# Patient Record
Sex: Female | Born: 1970 | Race: White | Hispanic: No | Marital: Married | Smoking: Current every day smoker
Health system: Southern US, Community
[De-identification: ages and names within clinical notes are randomized; demographics above are authoritative.]

## PROBLEM LIST (undated history)

## (undated) ENCOUNTER — Emergency Department (HOSPITAL_COMMUNITY): Admission: EM | Discharge status: Against Medical Advice | Payer: PRIVATE HEALTH INSURANCE

## (undated) DIAGNOSIS — F419 Anxiety disorder, unspecified: Secondary | ICD-10-CM

## (undated) HISTORY — DX: Anxiety disorder, unspecified: F41.9

## (undated) HISTORY — PX: TONSILLECTOMY: SUR1361

## (undated) HISTORY — PX: TUBAL LIGATION: SHX77

---

## 1998-01-13 ENCOUNTER — Other Ambulatory Visit: Admission: RE | Admit: 1998-01-13 | Discharge: 1998-01-13 | Payer: Self-pay | Admitting: Obstetrics and Gynecology

## 1999-01-19 ENCOUNTER — Other Ambulatory Visit: Admission: RE | Admit: 1999-01-19 | Discharge: 1999-01-19 | Payer: Self-pay | Admitting: *Deleted

## 1999-10-20 ENCOUNTER — Emergency Department (HOSPITAL_COMMUNITY): Admission: EM | Admit: 1999-10-20 | Discharge: 1999-10-20 | Payer: Self-pay | Admitting: Emergency Medicine

## 2000-01-15 ENCOUNTER — Emergency Department (HOSPITAL_COMMUNITY): Admission: EM | Admit: 2000-01-15 | Discharge: 2000-01-15 | Payer: Self-pay | Admitting: Emergency Medicine

## 2010-12-28 ENCOUNTER — Other Ambulatory Visit: Payer: Self-pay | Admitting: Infectious Diseases

## 2010-12-28 ENCOUNTER — Ambulatory Visit
Admission: RE | Admit: 2010-12-28 | Discharge: 2010-12-28 | Disposition: A | Discharge status: Home / Self Care | Payer: PRIVATE HEALTH INSURANCE | Source: Ambulatory Visit | Attending: Infectious Diseases | Admitting: Infectious Diseases

## 2010-12-28 DIAGNOSIS — R7611 Nonspecific reaction to tuberculin skin test without active tuberculosis: Secondary | ICD-10-CM

## 2014-04-29 ENCOUNTER — Other Ambulatory Visit: Payer: Self-pay | Admitting: Family Medicine

## 2014-04-29 DIAGNOSIS — Z1231 Encounter for screening mammogram for malignant neoplasm of breast: Secondary | ICD-10-CM

## 2014-05-06 ENCOUNTER — Other Ambulatory Visit: Payer: Self-pay | Admitting: Family Medicine

## 2014-05-06 DIAGNOSIS — Z1231 Encounter for screening mammogram for malignant neoplasm of breast: Secondary | ICD-10-CM

## 2014-05-11 ENCOUNTER — Ambulatory Visit
Admission: RE | Admit: 2014-05-11 | Discharge: 2014-05-11 | Disposition: A | Discharge status: Home / Self Care | Payer: PRIVATE HEALTH INSURANCE | Source: Ambulatory Visit | Attending: Family Medicine | Admitting: Family Medicine

## 2014-05-11 DIAGNOSIS — Z1231 Encounter for screening mammogram for malignant neoplasm of breast: Secondary | ICD-10-CM

## 2014-05-13 ENCOUNTER — Other Ambulatory Visit: Payer: Self-pay | Admitting: Family Medicine

## 2014-05-13 DIAGNOSIS — R928 Other abnormal and inconclusive findings on diagnostic imaging of breast: Secondary | ICD-10-CM

## 2014-05-31 ENCOUNTER — Ambulatory Visit
Admission: RE | Admit: 2014-05-31 | Discharge: 2014-05-31 | Disposition: A | Discharge status: Home / Self Care | Payer: PRIVATE HEALTH INSURANCE | Source: Ambulatory Visit | Attending: Family Medicine | Admitting: Family Medicine

## 2014-05-31 DIAGNOSIS — R928 Other abnormal and inconclusive findings on diagnostic imaging of breast: Secondary | ICD-10-CM

## 2014-07-05 ENCOUNTER — Inpatient Hospital Stay (HOSPITAL_COMMUNITY)
Admission: EM | Admit: 2014-07-05 | Discharge: 2014-07-10 | DRG: 339 | Disposition: A | Discharge status: Home / Self Care | Payer: No Typology Code available for payment source | Attending: Surgery | Admitting: Surgery

## 2014-07-05 ENCOUNTER — Emergency Department (HOSPITAL_COMMUNITY): Payer: No Typology Code available for payment source

## 2014-07-05 ENCOUNTER — Encounter (HOSPITAL_COMMUNITY): Payer: Self-pay | Admitting: *Deleted

## 2014-07-05 DIAGNOSIS — D649 Anemia, unspecified: Secondary | ICD-10-CM | POA: Diagnosis present

## 2014-07-05 DIAGNOSIS — F17210 Nicotine dependence, cigarettes, uncomplicated: Secondary | ICD-10-CM | POA: Diagnosis present

## 2014-07-05 DIAGNOSIS — K651 Peritoneal abscess: Secondary | ICD-10-CM

## 2014-07-05 DIAGNOSIS — K3532 Acute appendicitis with perforation and localized peritonitis, without abscess: Secondary | ICD-10-CM

## 2014-07-05 DIAGNOSIS — R109 Unspecified abdominal pain: Secondary | ICD-10-CM | POA: Diagnosis present

## 2014-07-05 DIAGNOSIS — D3502 Benign neoplasm of left adrenal gland: Secondary | ICD-10-CM | POA: Diagnosis present

## 2014-07-05 DIAGNOSIS — G4700 Insomnia, unspecified: Secondary | ICD-10-CM | POA: Diagnosis present

## 2014-07-05 DIAGNOSIS — K353 Acute appendicitis with localized peritonitis: Secondary | ICD-10-CM | POA: Diagnosis present

## 2014-07-05 DIAGNOSIS — K567 Ileus, unspecified: Secondary | ICD-10-CM | POA: Diagnosis not present

## 2014-07-05 DIAGNOSIS — J45909 Unspecified asthma, uncomplicated: Secondary | ICD-10-CM | POA: Diagnosis present

## 2014-07-05 DIAGNOSIS — Z800 Family history of malignant neoplasm of digestive organs: Secondary | ICD-10-CM

## 2014-07-05 DIAGNOSIS — K3533 Acute appendicitis with perforation and localized peritonitis, with abscess: Secondary | ICD-10-CM | POA: Diagnosis present

## 2014-07-05 LAB — CBC WITH DIFFERENTIAL/PLATELET
Basophils Absolute: 0 10*3/uL (ref 0.0–0.1)
Basophils Relative: 0 % (ref 0–1)
Eosinophils Absolute: 0.2 10*3/uL (ref 0.0–0.7)
Eosinophils Relative: 2 % (ref 0–5)
HCT: 38.7 % (ref 36.0–46.0)
Hemoglobin: 12.3 g/dL (ref 12.0–15.0)
LYMPHS ABS: 1.6 10*3/uL (ref 0.7–4.0)
LYMPHS PCT: 14 % (ref 12–46)
MCH: 28.9 pg (ref 26.0–34.0)
MCHC: 31.8 g/dL (ref 30.0–36.0)
MCV: 90.8 fL (ref 78.0–100.0)
Monocytes Absolute: 1 10*3/uL (ref 0.1–1.0)
Monocytes Relative: 8 % (ref 3–12)
NEUTROS ABS: 8.7 10*3/uL — AB (ref 1.7–7.7)
NEUTROS PCT: 76 % (ref 43–77)
PLATELETS: 320 10*3/uL (ref 150–400)
RBC: 4.26 MIL/uL (ref 3.87–5.11)
RDW: 14.6 % (ref 11.5–15.5)
WBC: 11.5 10*3/uL — AB (ref 4.0–10.5)

## 2014-07-05 LAB — COMPREHENSIVE METABOLIC PANEL
ALK PHOS: 153 U/L — AB (ref 39–117)
ALT: 56 U/L — AB (ref 0–35)
AST: 15 U/L (ref 0–37)
Albumin: 2.9 g/dL — ABNORMAL LOW (ref 3.5–5.2)
Anion gap: 14 (ref 5–15)
BUN: 10 mg/dL (ref 6–23)
CHLORIDE: 96 meq/L (ref 96–112)
CO2: 26 meq/L (ref 19–32)
Calcium: 9.2 mg/dL (ref 8.4–10.5)
Creatinine, Ser: 0.53 mg/dL (ref 0.50–1.10)
GFR calc Af Amer: >90 mL/min (ref >90)
GLUCOSE: 97 mg/dL (ref 70–99)
POTASSIUM: 4 meq/L (ref 3.7–5.3)
SODIUM: 136 meq/L — AB (ref 137–147)
Total Bilirubin: 0.3 mg/dL (ref 0.3–1.2)
Total Protein: 7.2 g/dL (ref 6.0–8.3)

## 2014-07-05 LAB — URINE MICROSCOPIC-ADD ON

## 2014-07-05 LAB — LIPASE, BLOOD: Lipase: 17 U/L (ref 11–59)

## 2014-07-05 LAB — URINALYSIS, ROUTINE W REFLEX MICROSCOPIC
BILIRUBIN URINE: NEGATIVE
GLUCOSE, UA: NEGATIVE mg/dL
Hgb urine dipstick: NEGATIVE
KETONES UR: NEGATIVE mg/dL
Nitrite: NEGATIVE
PH: 5 (ref 5.0–8.0)
Protein, ur: NEGATIVE mg/dL
SPECIFIC GRAVITY, URINE: 1.017 (ref 1.005–1.030)
Urobilinogen, UA: 0.2 mg/dL (ref 0.0–1.0)

## 2014-07-05 LAB — POC URINE PREG, ED: Preg Test, Ur: NEGATIVE

## 2014-07-05 MED ORDER — IOHEXOL 300 MG/ML  SOLN
50.0000 mL | Freq: Once | INTRAMUSCULAR | Status: AC | PRN
  Administered 2014-07-05: 50 mL via ORAL

## 2014-07-05 MED ORDER — DIPHENHYDRAMINE HCL 12.5 MG/5ML PO ELIX: 12.5000 mg | ORAL_SOLUTION | Freq: Four times a day (QID) | ORAL | Status: DC | PRN

## 2014-07-05 MED ORDER — PIPERACILLIN-TAZOBACTAM 3.375 G IVPB
3.3750 g | Freq: Three times a day (TID) | INTRAVENOUS | Status: DC
  Administered 2014-07-06 – 2014-07-10 (×13): 3.375 g via INTRAVENOUS
  Filled 2014-07-05 (×14): qty 50

## 2014-07-05 MED ORDER — PIPERACILLIN-TAZOBACTAM 3.375 G IVPB: 3.3750 g | INTRAVENOUS | Status: DC

## 2014-07-05 MED ORDER — HEPARIN SODIUM (PORCINE) 5000 UNIT/ML IJ SOLN
5000.0000 [IU] | Freq: Three times a day (TID) | INTRAMUSCULAR | Status: DC
  Administered 2014-07-05 – 2014-07-07 (×4): 5000 [IU] via SUBCUTANEOUS
  Filled 2014-07-05 (×8): qty 1

## 2014-07-05 MED ORDER — IOHEXOL 300 MG/ML  SOLN
100.0000 mL | Freq: Once | INTRAMUSCULAR | Status: AC | PRN
  Administered 2014-07-05: 100 mL via INTRAVENOUS

## 2014-07-05 MED ORDER — PANTOPRAZOLE SODIUM 40 MG IV SOLR
40.0000 mg | Freq: Every day | INTRAVENOUS | Status: DC
  Administered 2014-07-05 – 2014-07-08 (×4): 40 mg via INTRAVENOUS
  Filled 2014-07-05 (×5): qty 40

## 2014-07-05 MED ORDER — ZOLPIDEM TARTRATE 5 MG PO TABS
5.0000 mg | ORAL_TABLET | Freq: Every day | ORAL | Status: DC
  Administered 2014-07-05 – 2014-07-09 (×5): 5 mg via ORAL
  Filled 2014-07-05 (×5): qty 1

## 2014-07-05 MED ORDER — ALBUTEROL SULFATE HFA 108 (90 BASE) MCG/ACT IN AERS: 1.0000 | INHALATION_SPRAY | RESPIRATORY_TRACT | Status: DC | PRN

## 2014-07-05 MED ORDER — POTASSIUM CHLORIDE IN NACL 20-0.9 MEQ/L-% IV SOLN
INTRAVENOUS | Status: DC
  Administered 2014-07-05 – 2014-07-08 (×7): via INTRAVENOUS
  Filled 2014-07-05 (×12): qty 1000

## 2014-07-05 MED ORDER — ONDANSETRON HCL 4 MG/2ML IJ SOLN
4.0000 mg | Freq: Four times a day (QID) | INTRAMUSCULAR | Status: DC | PRN
  Administered 2014-07-06 – 2014-07-09 (×2): 4 mg via INTRAVENOUS
  Filled 2014-07-05 (×2): qty 2

## 2014-07-05 MED ORDER — SODIUM CHLORIDE 0.9 % IV BOLUS (SEPSIS)
1000.0000 mL | Freq: Once | INTRAVENOUS | Status: AC
  Administered 2014-07-05: 1000 mL via INTRAVENOUS

## 2014-07-05 MED ORDER — PIPERACILLIN-TAZOBACTAM 3.375 G IVPB: 3.3750 g | Freq: Three times a day (TID) | INTRAVENOUS | Status: DC

## 2014-07-05 MED ORDER — PIPERACILLIN-TAZOBACTAM 3.375 G IVPB 30 MIN
3.3750 g | INTRAVENOUS | Status: AC
  Administered 2014-07-05: 3.375 g via INTRAVENOUS
  Filled 2014-07-05: qty 50

## 2014-07-05 MED ORDER — MONTELUKAST SODIUM 10 MG PO TABS
10.0000 mg | ORAL_TABLET | Freq: Every day | ORAL | Status: DC | PRN
  Filled 2014-07-05: qty 1

## 2014-07-05 MED ORDER — ACETAMINOPHEN 325 MG PO TABS
650.0000 mg | ORAL_TABLET | Freq: Four times a day (QID) | ORAL | Status: DC | PRN
  Administered 2014-07-07: 650 mg via ORAL
  Filled 2014-07-05: qty 2

## 2014-07-05 MED ORDER — DIPHENHYDRAMINE HCL 50 MG/ML IJ SOLN: 12.5000 mg | Freq: Four times a day (QID) | INTRAMUSCULAR | Status: DC | PRN

## 2014-07-05 MED ORDER — ACETAMINOPHEN 650 MG RE SUPP: 650.0000 mg | Freq: Four times a day (QID) | RECTAL | Status: DC | PRN

## 2014-07-05 MED ORDER — MORPHINE SULFATE 2 MG/ML IJ SOLN
1.0000 mg | INTRAMUSCULAR | Status: DC | PRN
  Administered 2014-07-05: 4 mg via INTRAVENOUS
  Administered 2014-07-05: 2 mg via INTRAVENOUS
  Administered 2014-07-06 – 2014-07-08 (×18): 4 mg via INTRAVENOUS
  Filled 2014-07-05 (×14): qty 2
  Filled 2014-07-05: qty 1
  Filled 2014-07-05 (×5): qty 2

## 2014-07-05 NOTE — Progress Notes (Signed)
  CARE MANAGEMENT ED NOTE 07/05/2014  Patient:  Kelli Lamb, Kelli Lamb   Account Number:  [id number]  Date Initiated:  07/05/2014  Documentation initiated by:  Livia Snellen  Subjective/Objective Assessment:   Patient presents to Ed with abdominal pain and fever for one week.     Subjective/Objective Assessment Detail:   CT of abdomen: Findings consistent with periappendiceal abscess, likely due to  appendiceal rupture. This periappendiceal abscess measures 5.1 x 3.6  x 2.5 cm. A second developing abscess is noted slightly superior to  the urinary bladder on the right measuring 5.5 x 3.5 x 2.9 cm. There  is extensive inflammation throughout the pelvis with wall thickening  in the urinary bladder likely due to the nearby abscess as well as  multiple loops of bowel with thickened walls and surrounding  mesenteric inflammation. Small amount of fluid in the cul-de-sac is  likely due to the inflammation from the periappendiceal abscesses as  well.     Action/Plan:   NPO, IV fluids, IV antibiotics   Action/Plan Detail:   Anticipated DC Date:       Status Recommendation to Physician:   Result of Recommendation:    Other ED [place]  Other  PCP issues    Choice offered to / List presented to:            Status of service:  Completed, signed off  ED Comments:   ED Comments Detail:  EDCM spoke to patient at bedside.  Patient confirms he pcp is located at [facility] in [place].  Dr. Leonia Reader.  System updated.

## 2014-07-05 NOTE — ED Provider Notes (Signed)
CSN: [id number]     Arrival date & time 07/05/14  1342 History   First MD Initiated Contact with Patient 07/05/14 1500     Chief Complaint  Patient presents with  . Abdominal Pain     (Consider location/radiation/quality/duration/timing/severity/associated sxs/prior Treatment) HPI Patient presents to the emergency department with abdominal pain that started 1 week ago.  The patient states she woke up late Monday evening with abdominal pain that worsened over the next couple of days.  She said she was seen by her primary care doctor who did a plain film of her abdomen and noted that she had constipation and possibly an intense large liver.  The patient states that she was put on lactulose and over-the-counter laxatives.  Patient states that her abdominal pain worsened.  She was called on Friday with a report that her white blood cell count was 19,000.  She states that she felt like she could no longer continue the laxatives and came in to the emergency room department for evaluation.  The patient states she has not had any vomiting, diarrhea, weakness, dizziness, headache, blurred vision, chest pain, shortness of breath, rash, near syncope, lightheadedness or syncope.  The patient states she has had some nausea as well.  Patient states nothing seems to make her condition better, but palpation and movement make the pain worse. History reviewed. No pertinent past medical history. History reviewed. No pertinent past surgical history. History reviewed. No pertinent family history. History  Substance Use Topics  . Smoking status: Current Every Day Smoker -- 0.50 packs/day for 5 years    Types: Cigarettes  . Smokeless tobacco: Not on file  . Alcohol Use: No   OB History    No data available     Review of Systems All other systems negative except as documented in the HPI. All pertinent positives and negatives as reviewed in the HPI.  Allergies  Review of patient's allergies indicates no known  allergies.  Home Medications   Prior to Admission medications   Medication Sig Start Date End Date Taking? Authorizing Provider  albuterol (PROVENTIL HFA;VENTOLIN HFA) 108 (90 BASE) MCG/ACT inhaler Inhale 1 puff into the lungs every 4 (four) hours as needed for wheezing or shortness of breath.   Yes Historical Provider, MD  ciprofloxacin (CIPRO) 500 MG tablet Take 500 mg by mouth 2 (two) times daily. She is taking for 7 days. She started on 06/30/14 and has not completed. 06/30/14  Yes Historical Provider, MD  HYDROMET 5-1.5 MG/5ML syrup Take 5 mLs by mouth every 6 (six) hours as needed for cough.  06/16/14  Yes Historical Provider, MD  lactulose (CHRONULAC) 10 GM/15ML solution  06/30/14  Yes Historical Provider, MD  montelukast (SINGULAIR) 10 MG tablet Take 10 mg by mouth daily as needed (For allergies.).  04/15/14  Yes Historical Provider, MD  zolpidem (AMBIEN) 10 MG tablet Take 10 mg by mouth at bedtime.  06/23/14  Yes Historical Provider, MD   BP 124/72 mmHg  Pulse 90  Temp(Src) 98.2 F (36.8 C) (Oral)  Resp 18  SpO2 95%  LMP 06/11/2014 (Exact Date) Physical Exam  Constitutional: She is oriented to person, place, and time. She appears well-developed and well-nourished. No distress.  HENT:  Head: Normocephalic and atraumatic.  Mouth/Throat: Oropharynx is clear and moist.  Eyes: EOM are normal. Pupils are equal, round, and reactive to light.  Neck: Normal range of motion. Neck supple.  Cardiovascular: Normal rate, regular rhythm and normal heart sounds.  Exam reveals no  gallop and no friction rub.   No murmur heard. Pulmonary/Chest: Effort normal and breath sounds normal. No respiratory distress. She has no wheezes. She has no rales.  Abdominal: Soft. Normal appearance and bowel sounds are normal. She exhibits no distension. There is tenderness. There is guarding. There is no rigidity, no rebound and no CVA tenderness. No hernia.    Neurological: She is alert and oriented to person,  place, and time. She exhibits normal muscle tone. Coordination normal.  Skin: Skin is warm and dry. No rash noted. No erythema.  Nursing note and vitals reviewed.   ED Course  Procedures (including critical care time) Labs Review Labs Reviewed  CBC WITH DIFFERENTIAL - Abnormal; Notable for the following:    WBC 11.5 (*)    Neutro Abs 8.7 (*)    All other components within normal limits  COMPREHENSIVE METABOLIC PANEL - Abnormal; Notable for the following:    Sodium 136 (*)    Albumin 2.9 (*)    ALT 56 (*)    Alkaline Phosphatase 153 (*)    All other components within normal limits  URINALYSIS, ROUTINE W REFLEX MICROSCOPIC - Abnormal; Notable for the following:    Leukocytes, UA MODERATE (*)    All other components within normal limits  URINE MICROSCOPIC-ADD ON - Abnormal; Notable for the following:    Squamous Epithelial / LPF FEW (*)    All other components within normal limits  LIPASE, BLOOD  POC URINE PREG, ED    Imaging Review Ct Abdomen Pelvis W Contrast  07/05/2014   CLINICAL DATA:  One week history of fever abdominal pain. Elevated white blood cell count  EXAM: CT ABDOMEN AND PELVIS WITH CONTRAST  TECHNIQUE: Multidetector CT imaging of the abdomen and pelvis was performed using the standard protocol following bolus administration of intravenous contrast. Oral contrast was also administered.  CONTRAST:  29mL OMNIPAQUE IOHEXOL 300 MG/ML SOLN, 139mL OMNIPAQUE IOHEXOL 300 MG/ML SOLN  COMPARISON:  None.  FINDINGS: There is mild atelectasis in the right middle lobe. Lung bases are otherwise clear.  Liver is enlarged, measuring 21.8 cm in length. No focal liver lesions are identified. Gallbladder wall is not appreciably thickened. There is no biliary duct dilatation.  Spleen, pancreas, and right adrenal appear normal. There is a left adrenal adenoma measuring 2.6 x 1.7 cm.  Kidneys bilaterally show no mass or hydronephrosis on either side. There is no renal or ureteral calculus on  either side.  In the pelvis, the urinary bladder wall is thickened. There is no pelvic mass. There is a small amount of free fluid in the cul-de-sac. There is extensive inflammation throughout the pelvis with wall thickening as well as thickening of multiple loops of bowel in the pelvis. There is a developing abscess immediately adjacent to the cecum in the mid pelvis measuring 5.1 x 3.6 x 2.5 cm. This finding is most likely due to ruptured appendix with developing periappendiceal region abscess. There is wall thickening in the adjacent cecum as well as thickening of the wall of the terminal ileum, likely due to inflammation from the periappendiceal region abscess. A second developing abscess is noted slightly superior to the urinary bladder in the right mid pelvis measuring 5.5 x 3.5 x 2.9 cm. The uterus appears somewhat enlarged. The uterus may well be edematous from the surrounding inflammation.  There is no appreciable bowel obstruction. There is no free air or portal venous air.  No adenopathy is identified in the abdomen or pelvis. Aorta is nonaneurysmal.  There are no blastic or lytic bone lesions.  IMPRESSION: Findings consistent with periappendiceal abscess, likely due to appendiceal rupture. This periappendiceal abscess measures 5.1 x 3.6 x 2.5 cm. A second developing abscess is noted slightly superior to the urinary bladder on the right measuring 5.5 x 3.5 x 2.9 cm. There is extensive inflammation throughout the pelvis with wall thickening in the urinary bladder likely due to the nearby abscess as well as multiple loops of bowel with thickened walls and surrounding mesenteric inflammation. Small amount of fluid in the cul-de-sac is likely due to the inflammation from the periappendiceal abscesses as well.  Uterus is prominent, likely due at least in part to edema from the surrounding inflammation. Leiomyomatous change in the uterus cannot be excluded, however.  Liver enlarged without focal lesion.  There  is a left adrenal adenoma, a benign finding.  Critical Value/emergent results were called by telephone at the time of interpretation on 07/05/2014 at 5:08 pm to [facility], [facility] , who verbally acknowledged these results.   Electronically Signed   By: Lowella Grip M.D.   On: 07/05/2014 17:10      MDM   Final diagnoses:  None    I spoke with general surgery.  The patient and they will be in to evaluate her for surgical intervention    Brent General, PA-C 07/05/14 1750  Evelina Bucy, MD 07/05/14 Lurena Nida

## 2014-07-05 NOTE — Progress Notes (Signed)
Utilization Review completed.  Teruko Brinae Woods RN CM  

## 2014-07-05 NOTE — ED Notes (Signed)
Pt reports fever and abdominal pain x1 week. Went to pcp last wed, xray showed that pts liver was enlarged and constipated. pcp called and told pt her WBC was 19. Pt started cipro on Wednesday and lactulose. Last normal bowel movement 12/14. Abdominal pain 5/10-10/10. Reports nausea. Denies vomiting.

## 2014-07-05 NOTE — ED Notes (Signed)
Wed Started on Lactulose and stool softner. No BM in 1 week.

## 2014-07-05 NOTE — Progress Notes (Addendum)
ANTIBIOTIC CONSULT NOTE - INITIAL  Pharmacy Consult for zosyn Indication: perforated appendix  No Known Allergies  Patient Measurements:   Adjusted Body Weight:   Vital Signs: Temp: 98.2 F (36.8 C) (12/21 1719) Temp Source: Oral (12/21 1719) BP: 122/74 mmHg (12/21 1830) Pulse Rate: 93 (12/21 1730) Intake/Output from previous day:   Intake/Output from this shift: Total I/O In: 1000 [I.V.:1000] Out: -   Labs:  Recent Labs  07/05/14 1441  WBC 11.5*  HGB 12.3  PLT 320  CREATININE 0.53   CrCl cannot be calculated (Unknown ideal weight.). No results for input(s): VANCOTROUGH, VANCOPEAK, VANCORANDOM, GENTTROUGH, GENTPEAK, GENTRANDOM, TOBRATROUGH, TOBRAPEAK, TOBRARND, AMIKACINPEAK, AMIKACINTROU, AMIKACIN in the last 72 hours.   Microbiology: No results found for this or any previous visit (from the past 720 hour(s)).  Medical History: History reviewed. No pertinent past medical history.   Assessment: 5 YOf presents to ED with abdominal pain and fever x 1 week.  CT reveals extensive inflammation throughout pelvis  And developing abscess adjacent to cecum  - these are consistent with ruptured appendix.  Orders for pharmacy to dose zosyn  12/21 >> zosyn  >>  Tmax: afeb WBCs: slightly elevated Renal: SCr WNL  No cultures ordered  Goal of Therapy:  Dose for indication and patient-specific parameters  Plan:   Zosyn 3.375gm IV x 1 over 45min in ED then 3.375gm IV q8h over 4h infusion  No further dosing adjustment needed at this time  Doreene Eland, PharmD, BCPS.   Pager: [phone]  07/05/2014,6:40 PM

## 2014-07-05 NOTE — H&P (Signed)
Kelli Lamb is an 43 y.o. female.   PCP:    Chief Complaint: Abd pain HPI: Pt reports abdominal pain and fever for about 1 week. It started last week on Monday evening.  Pain and fever up to 102.  She stayed in be on Tuesday, and Wed she saw her PCP.   Films showed enlarged liver, and constipation.  She was found to have an elevated WBC and started on Cipro/lacutlose 06/30/14.  Last BM 06/28/14. Since then she has not improved,she had an Ultrasound of the abdomen on Saturday 07/03/14, which she had no report on till today.  It was reported normal but pt felt no better and came to the ED today after lunch time. She has not been able to eat for most of the week.  Right now she is tender and has discomfort both right and left lower abdomen, left side more so than right side right now.   Work up in the ED shows she is afebrile, a little tachycardic, WBC is mildly elevated, 11.5.  ALT is up slightly. CT scan shows;  There is extensive inflammation throughout the pelvis with wall thickening as well as thickening of multiple loops of bowel in the pelvis. There is a developing abscess immediately adjacent to the cecum in the mid pelvis measuring 5.1 x 3.6 x 2.5 cm. This finding is most likely due to ruptured appendix with developing periappendiceal region abscess. There is wall thickening in the adjacent cecum as well as thickening of the wall of the terminal ileum, likely due to inflammation from the periappendiceal region abscess. A second developing abscess is noted slightly superior to the urinary bladder in the right mid pelvis measuring 5.5 x 3.5 x 2.9 cm. The uterus appears somewhat enlarged. The uterus may well be edematous from the surrounding inflammation. No obstruction free, air or portal gas. Films reviewed with Dr. Excell Seltzer and we plan to admit, place on IV fluids, antibiotics and order drain placement for the AM.   History reviewed. No pertinent past medical history. None History reviewed. No  pertinent past surgical history. None History reviewed. No pertinent family history. Social History:  reports that she has been smoking Cigarettes.  She has a 2.5 pack-year smoking history. She does not have any smokeless tobacco history on file. She reports that she does not drink alcohol or use illicit drugs. 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 Allergies: No Known Allergies   Prior to Admission medications   Medication Sig Start Date End Date Taking? Authorizing Provider  albuterol (PROVENTIL HFA;VENTOLIN HFA) 108 (90 BASE) MCG/ACT inhaler Inhale 1 puff into the lungs every 4 (four) hours as needed for wheezing or shortness of breath.   Yes Historical Provider, MD  ciprofloxacin (CIPRO) 500 MG tablet Take 500 mg by mouth 2 (two) times daily. She is taking for 7 days. She started on 06/30/14 and has not completed. 06/30/14  Yes Historical Provider, MD  HYDROMET 5-1.5 MG/5ML syrup Take 5 mLs by mouth every 6 (six) hours as needed for cough.  06/16/14  Yes Historical Provider, MD  lactulose (CHRONULAC) 10 GM/15ML solution  06/30/14  Yes Historical Provider, MD  montelukast (SINGULAIR) 10 MG tablet Take 10 mg by mouth daily as needed (For allergies.).  04/15/14  Yes Historical Provider, MD  zolpidem (AMBIEN) 10 MG tablet Take 10 mg by mouth at bedtime.  06/23/14  Yes Historical Provider, MD     Results for orders placed or performed during the hospital encounter of 07/05/14 (from the past 48 hour(s))  Urinalysis, Routine w reflex microscopic     Status: Abnormal   Collection Time: 07/05/14  2:09 PM  Result Value Ref Range   Color, Urine YELLOW YELLOW   APPearance CLEAR CLEAR   Specific Gravity, Urine 1.017 1.005 - 1.030   pH 5.0 5.0 - 8.0   Glucose, UA NEGATIVE NEGATIVE mg/dL   Hgb urine dipstick NEGATIVE NEGATIVE   Bilirubin Urine NEGATIVE NEGATIVE   Ketones, ur NEGATIVE  NEGATIVE mg/dL   Protein, ur NEGATIVE NEGATIVE mg/dL   Urobilinogen, UA 0.2 0.0 - 1.0 mg/dL   Nitrite NEGATIVE NEGATIVE   Leukocytes, UA MODERATE (A) NEGATIVE  Urine microscopic-add on     Status: Abnormal   Collection Time: 07/05/14  2:09 PM  Result Value Ref Range   Squamous Epithelial / LPF FEW (A) RARE   WBC, UA 11-20 <3 WBC/hpf   RBC / HPF 0-2 <3 RBC/hpf   Bacteria, UA RARE RARE   Urine-Other MUCOUS PRESENT   POC Urine Pregnancy, ED  (If Pre-menopausal female) - do not order at [facility]     Status: None   Collection Time: 07/05/14  2:20 PM  Result Value Ref Range   Preg Test, Ur NEGATIVE NEGATIVE    Comment:        THE SENSITIVITY OF THIS METHODOLOGY IS >24 mIU/mL   CBC with Differential     Status: Abnormal   Collection Time: 07/05/14  2:41 PM  Result Value Ref Range   WBC 11.5 (H) 4.0 - 10.5 K/uL   RBC 4.26 3.87 - 5.11 MIL/uL   Hemoglobin 12.3 12.0 - 15.0 g/dL   HCT 38.7 36.0 - 46.0 %   MCV 90.8 78.0 - 100.0 fL   MCH 28.9 26.0 - 34.0 pg   MCHC 31.8 30.0 - 36.0 g/dL   RDW 14.6 11.5 - 15.5 %   Platelets 320 150 - 400 K/uL   Neutrophils Relative % 76 43 - 77 %   Neutro Abs 8.7 (H) 1.7 - 7.7 K/uL   Lymphocytes Relative 14 12 - 46 %   Lymphs Abs 1.6 0.7 - 4.0 K/uL   Monocytes Relative 8 3 - 12 %   Monocytes Absolute 1.0 0.1 - 1.0 K/uL   Eosinophils Relative 2 0 - 5 %   Eosinophils Absolute 0.2 0.0 - 0.7 K/uL   Basophils Relative 0 0 - 1 %   Basophils Absolute 0.0 0.0 - 0.1 K/uL  Comprehensive metabolic panel     Status: Abnormal   Collection Time: 07/05/14  2:41 PM  Result Value Ref Range   Sodium 136 (L) 137 - 147 mEq/L   Potassium 4.0 3.7 - 5.3 mEq/L   Chloride 96 96 - 112 mEq/L   CO2 26 19 - 32 mEq/L   Glucose, Bld 97 70 - 99 mg/dL   BUN 10 6 - 23 mg/dL   Creatinine, Ser 0.53 0.50 - 1.10 mg/dL   Calcium 9.2 8.4 - 10.5 mg/dL   Total Protein 7.2 6.0 - 8.3 g/dL   Albumin 2.9 (L) 3.5 - 5.2 g/dL   AST 15 0 - 37 U/L   ALT 56 (H) 0 - 35 U/L   Alkaline Phosphatase  153 (H) 39 - 117 U/L   Total Bilirubin 0.3 0.3 - 1.2 mg/dL   GFR calc non Af Amer >90 >90 mL/min  GFR calc Af Amer >90 >90 mL/min    Comment: (NOTE) The eGFR has been calculated using the CKD EPI equation. This calculation has not been validated in all clinical situations. eGFR's persistently <90 mL/min signify possible Chronic Kidney Disease.    Anion gap 14 5 - 15  Lipase, blood     Status: None   Collection Time: 07/05/14  2:41 PM  Result Value Ref Range   Lipase 17 11 - 59 U/L   Ct Abdomen Pelvis W Contrast  07/05/2014   CLINICAL DATA:  One week history of fever abdominal pain. Elevated white blood cell count  EXAM: CT ABDOMEN AND PELVIS WITH CONTRAST  TECHNIQUE: Multidetector CT imaging of the abdomen and pelvis was performed using the standard protocol following bolus administration of intravenous contrast. Oral contrast was also administered.  CONTRAST:  84mL OMNIPAQUE IOHEXOL 300 MG/ML SOLN, 122mL OMNIPAQUE IOHEXOL 300 MG/ML SOLN  COMPARISON:  None.  FINDINGS: There is mild atelectasis in the right middle lobe. Lung bases are otherwise clear.  Liver is enlarged, measuring 21.8 cm in length. No focal liver lesions are identified. Gallbladder wall is not appreciably thickened. There is no biliary duct dilatation.  Spleen, pancreas, and right adrenal appear normal. There is a left adrenal adenoma measuring 2.6 x 1.7 cm.  Kidneys bilaterally show no mass or hydronephrosis on either side. There is no renal or ureteral calculus on either side.  In the pelvis, the urinary bladder wall is thickened. There is no pelvic mass. There is a small amount of free fluid in the cul-de-sac. There is extensive inflammation throughout the pelvis with wall thickening as well as thickening of multiple loops of bowel in the pelvis. There is a developing abscess immediately adjacent to the cecum in the mid pelvis measuring 5.1 x 3.6 x 2.5 cm. This finding is most likely due to ruptured appendix with developing  periappendiceal region abscess. There is wall thickening in the adjacent cecum as well as thickening of the wall of the terminal ileum, likely due to inflammation from the periappendiceal region abscess. A second developing abscess is noted slightly superior to the urinary bladder in the right mid pelvis measuring 5.5 x 3.5 x 2.9 cm. The uterus appears somewhat enlarged. The uterus may well be edematous from the surrounding inflammation.  There is no appreciable bowel obstruction. There is no free air or portal venous air.  No adenopathy is identified in the abdomen or pelvis. Aorta is nonaneurysmal. There are no blastic or lytic bone lesions.  IMPRESSION: Findings consistent with periappendiceal abscess, likely due to appendiceal rupture. This periappendiceal abscess measures 5.1 x 3.6 x 2.5 cm. A second developing abscess is noted slightly superior to the urinary bladder on the right measuring 5.5 x 3.5 x 2.9 cm. There is extensive inflammation throughout the pelvis with wall thickening in the urinary bladder likely due to the nearby abscess as well as multiple loops of bowel with thickened walls and surrounding mesenteric inflammation. Small amount of fluid in the cul-de-sac is likely due to the inflammation from the periappendiceal abscesses as well.  Uterus is prominent, likely due at least in part to edema from the surrounding inflammation. Leiomyomatous change in the uterus cannot be excluded, however.  Liver enlarged without focal lesion.  There is a left adrenal adenoma, a benign finding.  Critical Value/emergent results were called by telephone at the time of interpretation on 07/05/2014 at 5:08 pm to [facility], [facility] , who verbally acknowledged these results.   Electronically  Signed   By: Lowella Grip M.D.   On: 07/05/2014 17:10    Review of Systems  Constitutional: Positive for fever (up to 102) and chills. Negative for weight loss.  HENT: Negative.   Eyes: Negative.   Respiratory:  Positive for wheezing.   Cardiovascular: Negative.   Gastrointestinal: Positive for nausea, abdominal pain and constipation (chronic). Negative for heartburn, vomiting and diarrhea.  Genitourinary: Negative.   Musculoskeletal: Negative.   Skin: Negative.   Neurological: Negative.   Endo/Heme/Allergies: Negative.   Psychiatric/Behavioral: Negative.     Blood pressure 124/72, pulse 90, temperature 98.2 F (36.8 C), temperature source Oral, resp. rate 18, last menstrual period 06/11/2014, SpO2 95 %. Physical Exam  Constitutional: She is oriented to person, place, and time. She appears well-developed and well-nourished. No distress.  HENT:  Head: Normocephalic and atraumatic.  Nose: Nose normal.  Eyes: Conjunctivae and EOM are normal. Pupils are equal, round, and reactive to light. Right eye exhibits no discharge. Left eye exhibits no discharge. No scleral icterus.  Neck: Normal range of motion. Neck supple. No JVD present. No tracheal deviation present. No thyromegaly present.  Cardiovascular: Regular rhythm and intact distal pulses.   No murmur heard. Hr 90-100 range  Respiratory: Effort normal and breath sounds normal. No respiratory distress. She has no wheezes. She has no rales. She exhibits no tenderness.  GI: Soft. She exhibits no distension and no mass. There is tenderness (lower abdomen, currently the left side is more tender than the right.). There is no guarding.  Musculoskeletal: She exhibits no tenderness.  Lymphadenopathy:    She has no cervical adenopathy.  Neurological: She is alert and oriented to person, place, and time.  Skin: Skin is warm and dry. No rash noted. She is not diaphoretic. No erythema. No pallor.  Psychiatric: She has a normal mood and affect. Her behavior is normal. Judgment and thought content normal.     Assessment/Plan Ruptured appendix with 2 intraabdominal abscesses Tobacco use.  Plan:  Admit, IV Zosyn, hydrate, and IR percutaneous  drain.  Wylie Russon 07/05/2014, 5:26 PM

## 2014-07-06 ENCOUNTER — Encounter (HOSPITAL_COMMUNITY): Payer: Self-pay | Admitting: Radiology

## 2014-07-06 ENCOUNTER — Inpatient Hospital Stay (HOSPITAL_COMMUNITY): Payer: No Typology Code available for payment source

## 2014-07-06 LAB — BASIC METABOLIC PANEL
Anion gap: 9 (ref 5–15)
BUN: 7 mg/dL (ref 6–23)
CO2: 24 mmol/L (ref 19–32)
Calcium: 8.2 mg/dL — ABNORMAL LOW (ref 8.4–10.5)
Chloride: 103 mEq/L (ref 96–112)
Creatinine, Ser: 0.49 mg/dL — ABNORMAL LOW (ref 0.50–1.10)
GFR calc Af Amer: >90 mL/min (ref >90)
GFR calc non Af Amer: >90 mL/min (ref >90)
GLUCOSE: 102 mg/dL — AB (ref 70–99)
Potassium: 4 mmol/L (ref 3.5–5.1)
Sodium: 136 mmol/L (ref 135–145)

## 2014-07-06 LAB — APTT: aPTT: 31 seconds (ref 24–37)

## 2014-07-06 LAB — PROTIME-INR
INR: 1.16 (ref 0.00–1.49)
PROTHROMBIN TIME: 14.9 s (ref 11.6–15.2)

## 2014-07-06 LAB — CBC
HEMATOCRIT: 33.9 % — AB (ref 36.0–46.0)
Hemoglobin: 10.7 g/dL — ABNORMAL LOW (ref 12.0–15.0)
MCH: 28.8 pg (ref 26.0–34.0)
MCHC: 31.6 g/dL (ref 30.0–36.0)
MCV: 91.1 fL (ref 78.0–100.0)
Platelets: 293 10*3/uL (ref 150–400)
RBC: 3.72 MIL/uL — ABNORMAL LOW (ref 3.87–5.11)
RDW: 14.5 % (ref 11.5–15.5)
WBC: 12.4 10*3/uL — ABNORMAL HIGH (ref 4.0–10.5)

## 2014-07-06 LAB — PREALBUMIN: Prealbumin: 9.9 mg/dL — ABNORMAL LOW (ref 17.0–34.0)

## 2014-07-06 MED ORDER — FENTANYL CITRATE 0.05 MG/ML IJ SOLN
INTRAMUSCULAR | Status: AC
  Administered 2014-07-06: 13:00:00
  Filled 2014-07-06: qty 2

## 2014-07-06 MED ORDER — MIDAZOLAM HCL 2 MG/2ML IJ SOLN
INTRAMUSCULAR | Status: AC | PRN
  Administered 2014-07-06 (×4): 0.5 mg via INTRAVENOUS

## 2014-07-06 MED ORDER — FENTANYL CITRATE 0.05 MG/ML IJ SOLN
INTRAMUSCULAR | Status: AC | PRN
  Administered 2014-07-06 (×4): 25 ug via INTRAVENOUS

## 2014-07-06 MED ORDER — SODIUM CHLORIDE 0.9 % IV SOLN
INTRAVENOUS | Status: AC | PRN
  Administered 2014-07-06: 10 mL/h via INTRAVENOUS

## 2014-07-06 MED ORDER — MIDAZOLAM HCL 2 MG/2ML IJ SOLN
INTRAMUSCULAR | Status: AC
  Administered 2014-07-06: 13:00:00
  Filled 2014-07-06: qty 2

## 2014-07-06 NOTE — Progress Notes (Signed)
Subjective: No real change, she feels terrible and she's getting hungry now.  She is on for CT drain placement later this AM.  I have spoke with IR and they have her down.  Objective: Vital signs in last 24 hours: Temp:  [98.2 F (36.8 C)-99.6 F (37.6 C)] 98.9 F (37.2 C) (12/22 0456) Pulse Rate:  [85-104] 85 (12/22 0456) Resp:  [16-20] 16 (12/22 0456) BP: (106-132)/(56-88) 111/63 mmHg (12/22 0456) SpO2:  [94 %-96 %] 94 % (12/22 0456) Weight:  [74.2 kg (163 lb 9.3 oz)] 74.2 kg (163 lb 9.3 oz) (12/22 0700) Last BM Date:  (1 week ago per pt) Not much in I/O Afebrile, VSS WBC still up 12.4 K Intake/Output from previous day: 12/21 0701 - 12/22 0700 In: 1180 [P.O.:180; I.V.:1000] Out: -  Intake/Output this shift:    General appearance: alert, cooperative and no distress GI: soft, sore, no distension, still very tender.  she may be a little more comfortable than last PM.  Lab Results:   Recent Labs  07/05/14 1441 07/06/14 0513  WBC 11.5* 12.4*  HGB 12.3 10.7*  HCT 38.7 33.9*  PLT 320 293    BMET  Recent Labs  07/05/14 1441 07/06/14 0513  NA 136* 136  K 4.0 4.0  CL 96 103  CO2 26 24  GLUCOSE 97 102*  BUN 10 7  CREATININE 0.53 0.49*  CALCIUM 9.2 8.2*   PT/INR  Recent Labs  07/06/14 0513  LABPROT 14.9  INR 1.16     Recent Labs Lab 07/05/14 1441  AST 15  ALT 56*  ALKPHOS 153*  BILITOT 0.3  PROT 7.2  ALBUMIN 2.9*     Lipase     Component Value Date/Time   LIPASE 17 07/05/2014 1441     Studies/Results: Ct Abdomen Pelvis W Contrast  07/05/2014   CLINICAL DATA:  One week history of fever abdominal pain. Elevated white blood cell count  EXAM: CT ABDOMEN AND PELVIS WITH CONTRAST  TECHNIQUE: Multidetector CT imaging of the abdomen and pelvis was performed using the standard protocol following bolus administration of intravenous contrast. Oral contrast was also administered.  CONTRAST:  36mL OMNIPAQUE IOHEXOL 300 MG/ML SOLN, 158mL OMNIPAQUE  IOHEXOL 300 MG/ML SOLN  COMPARISON:  None.  FINDINGS: There is mild atelectasis in the right middle lobe. Lung bases are otherwise clear.  Liver is enlarged, measuring 21.8 cm in length. No focal liver lesions are identified. Gallbladder wall is not appreciably thickened. There is no biliary duct dilatation.  Spleen, pancreas, and right adrenal appear normal. There is a left adrenal adenoma measuring 2.6 x 1.7 cm.  Kidneys bilaterally show no mass or hydronephrosis on either side. There is no renal or ureteral calculus on either side.  In the pelvis, the urinary bladder wall is thickened. There is no pelvic mass. There is a small amount of free fluid in the cul-de-sac. There is extensive inflammation throughout the pelvis with wall thickening as well as thickening of multiple loops of bowel in the pelvis. There is a developing abscess immediately adjacent to the cecum in the mid pelvis measuring 5.1 x 3.6 x 2.5 cm. This finding is most likely due to ruptured appendix with developing periappendiceal region abscess. There is wall thickening in the adjacent cecum as well as thickening of the wall of the terminal ileum, likely due to inflammation from the periappendiceal region abscess. A second developing abscess is noted slightly superior to the urinary bladder in the right mid pelvis measuring 5.5 x 3.5  x 2.9 cm. The uterus appears somewhat enlarged. The uterus may well be edematous from the surrounding inflammation.  There is no appreciable bowel obstruction. There is no free air or portal venous air.  No adenopathy is identified in the abdomen or pelvis. Aorta is nonaneurysmal. There are no blastic or lytic bone lesions.  IMPRESSION: Findings consistent with periappendiceal abscess, likely due to appendiceal rupture. This periappendiceal abscess measures 5.1 x 3.6 x 2.5 cm. A second developing abscess is noted slightly superior to the urinary bladder on the right measuring 5.5 x 3.5 x 2.9 cm. There is extensive  inflammation throughout the pelvis with wall thickening in the urinary bladder likely due to the nearby abscess as well as multiple loops of bowel with thickened walls and surrounding mesenteric inflammation. Small amount of fluid in the cul-de-sac is likely due to the inflammation from the periappendiceal abscesses as well.  Uterus is prominent, likely due at least in part to edema from the surrounding inflammation. Leiomyomatous change in the uterus cannot be excluded, however.  Liver enlarged without focal lesion.  There is a left adrenal adenoma, a benign finding.  Critical Value/emergent results were called by telephone at the time of interpretation on 07/05/2014 at 5:08 pm to [facility] , who verbally acknowledged these results.   Electronically Signed   By: Lowella Grip M.D.   On: 07/05/2014 17:10    Medications: . heparin  5,000 Units Subcutaneous 3 times per day  . pantoprazole (PROTONIX) IV  40 mg Intravenous QHS  . piperacillin-tazobactam (ZOSYN)  IV  3.375 g Intravenous 3 times per day  . zolpidem  5 mg Oral QHS    Assessment/Plan Ruptured appendix with 2 intraabdominal abscesses Tobacco use.   Plan:  IR drain of abscess today.    LOS: 1 day    Kelli Lamb 07/06/2014

## 2014-07-06 NOTE — Procedures (Signed)
Technically successful CT guided aspiration of approximately 5 cc of serous fluid from the right lower abdominal quadrant. Samples from the aspiration were sent to laboratory.   Technically successful CT guided drainage catheter placement into right lower abdominal quadrant yielding 25 cc of purulent material.  Samples sent to laboratory.  No immediate post procedural complications.

## 2014-07-06 NOTE — H&P (Signed)
Reason for Consult: Intra-abdominal abscess Chief Complaint: Chief Complaint  Patient presents with  . Abdominal Pain   Referring Physician(s): CCS  History of Present Illness: Kelli Lamb is a 43 y.o. female who presented to ED 12/21 c/o epigastric pain with fever x 1 week, CT performed revealed findings consistent with periappendiceal abscess, IR received request for consult. The patient states her pain is mostly LLQ currently. She denies any chest pain, shortness of breath or palpitations. She denies any active signs of bleeding or excessive bruising. The patient denies any history of sleep apnea or chronic oxygen use. She has previously tolerated sedation without complications.   History reviewed. No pertinent past medical history.  History reviewed. No pertinent past surgical history.  Allergies: Review of patient's allergies indicates no known allergies.  Medications: Prior to Admission medications   Medication Sig Start Date End Date Taking? Authorizing Provider  albuterol (PROVENTIL HFA;VENTOLIN HFA) 108 (90 BASE) MCG/ACT inhaler Inhale 1 puff into the lungs every 4 (four) hours as needed for wheezing or shortness of breath.   Yes Historical Provider, MD  ciprofloxacin (CIPRO) 500 MG tablet Take 500 mg by mouth 2 (two) times daily. She is taking for 7 days. She started on 06/30/14 and has not completed. 06/30/14  Yes Historical Provider, MD  HYDROMET 5-1.5 MG/5ML syrup Take 5 mLs by mouth every 6 (six) hours as needed for cough.  06/16/14  Yes Historical Provider, MD  lactulose (CHRONULAC) 10 GM/15ML solution  06/30/14  Yes Historical Provider, MD  montelukast (SINGULAIR) 10 MG tablet Take 10 mg by mouth daily as needed (For allergies.).  04/15/14  Yes Historical Provider, MD  zolpidem (AMBIEN) 10 MG tablet Take 10 mg by mouth at bedtime.  06/23/14  Yes Historical Provider, MD    History reviewed. No pertinent family history.  History   Social History  . Marital Status:  Legally Separated    Spouse Name: N/A    Number of Children: N/A  . Years of Education: N/A   Social History Main Topics  . Smoking status: Current Every Day Smoker -- 0.50 packs/day for 5 years    Types: Cigarettes  . Smokeless tobacco: None  . Alcohol Use: No  . Drug Use: No  . Sexual Activity: None   Other Topics Concern  . None   Social History Narrative    Review of Systems: A 12 point ROS discussed and pertinent positives are indicated in the HPI above.  All other systems are negative.  Review of Systems  Vital Signs: BP 111/63 mmHg  Pulse 85  Temp(Src) 98.9 F (37.2 C) (Oral)  Resp 16  Ht 5\' 4"  (1.626 m)  Wt 163 lb 9.3 oz (74.2 kg)  BMI 28.06 kg/m2  SpO2 94%  LMP 06/11/2014 (Exact Date)  Physical Exam  Constitutional: She is oriented to person, place, and time. No distress.  HENT:  Head: Normocephalic and atraumatic.  Neck: No tracheal deviation present.  Cardiovascular: Normal rate and regular rhythm.  Exam reveals no gallop and no friction rub.   No murmur heard. Pulmonary/Chest: Effort normal. She has wheezes.  Abdominal: There is tenderness.  Diffuse tenderness left lower > right  Neurological: She is alert and oriented to person, place, and time.  Skin: She is not diaphoretic.    Imaging: Ct Abdomen Pelvis W Contrast  07/05/2014   CLINICAL DATA:  One week history of fever abdominal pain. Elevated white blood cell count  EXAM: CT ABDOMEN AND PELVIS WITH CONTRAST  TECHNIQUE:  Multidetector CT imaging of the abdomen and pelvis was performed using the standard protocol following bolus administration of intravenous contrast. Oral contrast was also administered.  CONTRAST:  82mL OMNIPAQUE IOHEXOL 300 MG/ML SOLN, 160mL OMNIPAQUE IOHEXOL 300 MG/ML SOLN  COMPARISON:  None.  FINDINGS: There is mild atelectasis in the right middle lobe. Lung bases are otherwise clear.  Liver is enlarged, measuring 21.8 cm in length. No focal liver lesions are identified.  Gallbladder wall is not appreciably thickened. There is no biliary duct dilatation.  Spleen, pancreas, and right adrenal appear normal. There is a left adrenal adenoma measuring 2.6 x 1.7 cm.  Kidneys bilaterally show no mass or hydronephrosis on either side. There is no renal or ureteral calculus on either side.  In the pelvis, the urinary bladder wall is thickened. There is no pelvic mass. There is a small amount of free fluid in the cul-de-sac. There is extensive inflammation throughout the pelvis with wall thickening as well as thickening of multiple loops of bowel in the pelvis. There is a developing abscess immediately adjacent to the cecum in the mid pelvis measuring 5.1 x 3.6 x 2.5 cm. This finding is most likely due to ruptured appendix with developing periappendiceal region abscess. There is wall thickening in the adjacent cecum as well as thickening of the wall of the terminal ileum, likely due to inflammation from the periappendiceal region abscess. A second developing abscess is noted slightly superior to the urinary bladder in the right mid pelvis measuring 5.5 x 3.5 x 2.9 cm. The uterus appears somewhat enlarged. The uterus may well be edematous from the surrounding inflammation.  There is no appreciable bowel obstruction. There is no free air or portal venous air.  No adenopathy is identified in the abdomen or pelvis. Aorta is nonaneurysmal. There are no blastic or lytic bone lesions.  IMPRESSION: Findings consistent with periappendiceal abscess, likely due to appendiceal rupture. This periappendiceal abscess measures 5.1 x 3.6 x 2.5 cm. A second developing abscess is noted slightly superior to the urinary bladder on the right measuring 5.5 x 3.5 x 2.9 cm. There is extensive inflammation throughout the pelvis with wall thickening in the urinary bladder likely due to the nearby abscess as well as multiple loops of bowel with thickened walls and surrounding mesenteric inflammation. Small amount of  fluid in the cul-de-sac is likely due to the inflammation from the periappendiceal abscesses as well.  Uterus is prominent, likely due at least in part to edema from the surrounding inflammation. Leiomyomatous change in the uterus cannot be excluded, however.  Liver enlarged without focal lesion.  There is a left adrenal adenoma, a benign finding.  Critical Value/emergent results were called by telephone at the time of interpretation on 07/05/2014 at 5:08 pm to [facility] , who verbally acknowledged these results.   Electronically Signed   By: Lowella Grip M.D.   On: 07/05/2014 17:10   Labs:  CBC:  Recent Labs  07/05/14 1441 07/06/14 0513  WBC 11.5* 12.4*  HGB 12.3 10.7*  HCT 38.7 33.9*  PLT 320 293    COAGS:  Recent Labs  07/06/14 0513  INR 1.16  APTT 31    BMP:  Recent Labs  07/05/14 1441 07/06/14 0513  NA 136* 136  K 4.0 4.0  CL 96 103  CO2 26 24  GLUCOSE 97 102*  BUN 10 7  CALCIUM 9.2 8.2*  CREATININE 0.53 0.49*  GFRNONAA >90 >90  GFRAA >90 >90    LIVER FUNCTION  TESTS:  Recent Labs  07/05/14 1441  BILITOT 0.3  AST 15  ALT 56*  ALKPHOS 153*  PROT 7.2  ALBUMIN 2.9*   Assessment and Plan: Abdominal pain and fever x 1 week Likely ruptured appendicitis with abscess and leukocytosis  IR received consult request for management.  Images reviewed with Dr. Pascal Lux and collection is amendable to percutaneous drain placement, the patient does have multiple collections and will possibly require additional drains or even surgery, this has been discussed with the patient today Patient has been NPO, no sq heparin given, labs reviewed Risks and Benefits discussed with the patient. All of the patient's questions were answered, patient is agreeable to proceed. Consent signed and in chart.   Thank you for this interesting consult.  I greatly enjoyed meeting Zohar Augusta and look forward to participating in their care.    I spent a total of 40  minutes face to face in clinical consultation, greater than 50% of which was counseling/coordinating care   Signed: Hedy Jacob 07/06/2014, 1:21 PM

## 2014-07-06 NOTE — Progress Notes (Signed)
INITIAL NUTRITION ASSESSMENT  DOCUMENTATION CODES Per approved criteria  -Not Applicable   INTERVENTION: -RD to follow up after diet advancement and if patient needs nutritional supplementation.  NUTRITION DIAGNOSIS: Inadequate oral intake related to abdominal pain as evidenced by Poor PO intake <50% x 1 week.   Goal: Pt to meet >/= 90% of their estimated nutrition needs   Monitor:  Diet advancement, weight, labs, I/O's  Reason for Assessment: Pt identified as at nutrition risk on the Malnutrition Screen Tool  Admitting Dx: Ruptured appendix with 2 intraabdominal abscesses  ASSESSMENT: Pt reports abdominal pain and fever for about 1 week. It started last week on Monday evening. She has not been able to eat for most of the week.  Pt reports weight loss of 5 lb over the past week d/t abdominal pain and eating smaller amounts than usual. Pt states that her appetite has been fair. Pt currently NPO for upcoming procedure.  RD to follow-up with patient, will monitor for diet advancement and PO intake.  Labs reviewed: Low Creatinine  Height: Ht Readings from Last 1 Encounters:  07/06/14 5\' 4"  (1.626 m)    Weight: Wt Readings from Last 1 Encounters:  07/06/14 163 lb 9.3 oz (74.2 kg)    Ideal Body Weight: 120 lb  % Ideal Body Weight: 136%  Wt Readings from Last 10 Encounters:  07/06/14 163 lb 9.3 oz (74.2 kg)    Usual Body Weight: 160 lb  % Usual Body Weight: 102%  BMI:  Body mass index is 28.06 kg/(m^2).  Estimated Nutritional Needs: Kcal: 1850-2050 Protein: 80-90g Fluid: 1.8L/day  Skin: intact  Diet Order: Diet NPO time specified Except for: Ice Chips, Sips with Meds, Other (See Comments)  EDUCATION NEEDS:  -Education not appropriate at this time   Intake/Output Summary (Last 24 hours) at 07/06/14 0928 Last data filed at 07/06/14 0455  Gross per 24 hour  Intake   1180 ml  Output      0 ml  Net   1180 ml    Last BM: 1 week ago per  pt  Labs:   Recent Labs Lab 07/05/14 1441 07/06/14 0513  NA 136* 136  K 4.0 4.0  CL 96 103  CO2 26 24  BUN 10 7  CREATININE 0.53 0.49*  CALCIUM 9.2 8.2*  GLUCOSE 97 102*    CBG (last 3)  No results for input(s): GLUCAP in the last 72 hours.  Scheduled Meds: . heparin  5,000 Units Subcutaneous 3 times per day  . pantoprazole (PROTONIX) IV  40 mg Intravenous QHS  . piperacillin-tazobactam (ZOSYN)  IV  3.375 g Intravenous 3 times per day  . zolpidem  5 mg Oral QHS    Continuous Infusions: . 0.9 % NaCl with KCl 20 mEq / L 125 mL/hr at 07/06/14 0410    History reviewed. No pertinent past medical history.  History reviewed. No pertinent past surgical history.  Clayton Bibles, MS, RD, LDN Pager: [phone] After Hours Pager: [phone]

## 2014-07-06 NOTE — Sedation Documentation (Signed)
Patient denies pain and is resting comfortably.  

## 2014-07-07 MED ORDER — HEPARIN SODIUM (PORCINE) 5000 UNIT/ML IJ SOLN
5000.0000 [IU] | Freq: Three times a day (TID) | INTRAMUSCULAR | Status: DC
  Administered 2014-07-07 – 2014-07-10 (×9): 5000 [IU] via SUBCUTANEOUS
  Filled 2014-07-07 (×12): qty 1

## 2014-07-07 MED ORDER — ALBUTEROL SULFATE (2.5 MG/3ML) 0.083% IN NEBU
2.5000 mg | INHALATION_SOLUTION | RESPIRATORY_TRACT | Status: DC | PRN
  Administered 2014-07-07: 2.5 mg via RESPIRATORY_TRACT
  Filled 2014-07-07: qty 3

## 2014-07-07 MED ORDER — ALBUTEROL SULFATE (2.5 MG/3ML) 0.083% IN NEBU: 2.5000 mg | INHALATION_SOLUTION | Freq: Four times a day (QID) | RESPIRATORY_TRACT | Status: DC | PRN

## 2014-07-07 MED ORDER — OXYCODONE-ACETAMINOPHEN 5-325 MG PO TABS: 1.0000 | ORAL_TABLET | ORAL | Status: DC | PRN

## 2014-07-07 MED ORDER — OXYCODONE-ACETAMINOPHEN 5-325 MG PO TABS
1.0000 | ORAL_TABLET | ORAL | Status: DC | PRN
  Administered 2014-07-07 – 2014-07-08 (×4): 1 via ORAL
  Filled 2014-07-07 (×4): qty 1

## 2014-07-07 MED ORDER — IPRATROPIUM-ALBUTEROL 0.5-2.5 (3) MG/3ML IN SOLN
3.0000 mL | Freq: Two times a day (BID) | RESPIRATORY_TRACT | Status: DC
Start: 2014-07-08 — End: 2014-07-10
  Administered 2014-07-08 – 2014-07-10 (×5): 3 mL via RESPIRATORY_TRACT
  Filled 2014-07-07 (×5): qty 3

## 2014-07-07 NOTE — Progress Notes (Signed)
Subjective: Stable and alert. Afebrile. Heart rate 94. SPO2 96% on room air. Percutaneous drainage was performed yesterday, and appears successful aspiration. Fluid and placement of pigtail drain. Her biggest complaint is this morning his pain at the drain site. Makes it difficult to get up and go to the bathroom. She is voiding okay.  denies nausea. Requesting something to eat.  Objective: Vital signs in last 24 hours: Temp:  [98.2 F (36.8 C)-100.3 F (37.9 C)] 98.2 F (36.8 C) (12/23 0216) Pulse Rate:  [85-101] 94 (12/23 0216) Resp:  [16-24] 16 (12/23 0216) BP: (99-119)/(48-79) 103/67 mmHg (12/23 0216) SpO2:  [91 %-100 %] 96 % (12/23 0216) Weight:  [163 lb 9.3 oz (74.2 kg)] 163 lb 9.3 oz (74.2 kg) (12/22 0700) Last BM Date:  (1 week ago per pt)  Intake/Output from previous day:   Intake/Output this shift:    General appearance: Alert and cooperative. Mental status normal. Minimal distress. Husband present Resp: Faint wheeze on deep respiration. Otherwise clear GI: Not distended. Somewhat tender with some guarding across lower abdomen. Drain in place with bloody fluid, somewhat cloudy. Nonenteric.  Lab Results:   Recent Labs  07/05/14 1441 07/06/14 0513  WBC 11.5* 12.4*  HGB 12.3 10.7*  HCT 38.7 33.9*  PLT 320 293   BMET  Recent Labs  07/05/14 1441 07/06/14 0513  NA 136* 136  K 4.0 4.0  CL 96 103  CO2 26 24  GLUCOSE 97 102*  BUN 10 7  CREATININE 0.53 0.49*  CALCIUM 9.2 8.2*   PT/INR  Recent Labs  07/06/14 0513  LABPROT 14.9  INR 1.16   ABG No results for input(s): PHART, HCO3 in the last 72 hours.  Invalid input(s): PCO2, PO2  Studies/Results: Ct Abdomen Pelvis W Contrast  07/05/2014   CLINICAL DATA:  One week history of fever abdominal pain. Elevated white blood cell count  EXAM: CT ABDOMEN AND PELVIS WITH CONTRAST  TECHNIQUE: Multidetector CT imaging of the abdomen and pelvis was performed using the standard protocol following bolus  administration of intravenous contrast. Oral contrast was also administered.  CONTRAST:  66mL OMNIPAQUE IOHEXOL 300 MG/ML SOLN, 168mL OMNIPAQUE IOHEXOL 300 MG/ML SOLN  COMPARISON:  None.  FINDINGS: There is mild atelectasis in the right middle lobe. Lung bases are otherwise clear.  Liver is enlarged, measuring 21.8 cm in length. No focal liver lesions are identified. Gallbladder wall is not appreciably thickened. There is no biliary duct dilatation.  Spleen, pancreas, and right adrenal appear normal. There is a left adrenal adenoma measuring 2.6 x 1.7 cm.  Kidneys bilaterally show no mass or hydronephrosis on either side. There is no renal or ureteral calculus on either side.  In the pelvis, the urinary bladder wall is thickened. There is no pelvic mass. There is a small amount of free fluid in the cul-de-sac. There is extensive inflammation throughout the pelvis with wall thickening as well as thickening of multiple loops of bowel in the pelvis. There is a developing abscess immediately adjacent to the cecum in the mid pelvis measuring 5.1 x 3.6 x 2.5 cm. This finding is most likely due to ruptured appendix with developing periappendiceal region abscess. There is wall thickening in the adjacent cecum as well as thickening of the wall of the terminal ileum, likely due to inflammation from the periappendiceal region abscess. A second developing abscess is noted slightly superior to the urinary bladder in the right mid pelvis measuring 5.5 x 3.5 x 2.9 cm. The uterus appears somewhat enlarged.  The uterus may well be edematous from the surrounding inflammation.  There is no appreciable bowel obstruction. There is no free air or portal venous air.  No adenopathy is identified in the abdomen or pelvis. Aorta is nonaneurysmal. There are no blastic or lytic bone lesions.  IMPRESSION: Findings consistent with periappendiceal abscess, likely due to appendiceal rupture. This periappendiceal abscess measures 5.1 x 3.6 x 2.5 cm.  A second developing abscess is noted slightly superior to the urinary bladder on the right measuring 5.5 x 3.5 x 2.9 cm. There is extensive inflammation throughout the pelvis with wall thickening in the urinary bladder likely due to the nearby abscess as well as multiple loops of bowel with thickened walls and surrounding mesenteric inflammation. Small amount of fluid in the cul-de-sac is likely due to the inflammation from the periappendiceal abscesses as well.  Uterus is prominent, likely due at least in part to edema from the surrounding inflammation. Leiomyomatous change in the uterus cannot be excluded, however.  Liver enlarged without focal lesion.  There is a left adrenal adenoma, a benign finding.  Critical Value/emergent results were called by telephone at the time of interpretation on 07/05/2014 at 5:08 pm to [facility] , who verbally acknowledged these results.   Electronically Signed   By: Lowella Grip Lamb.D.   On: 07/05/2014 17:10   Ct Aspiration  07/06/2014   INDICATION: History of ruptured appendicitis, now with multiple peripherally enhancing fluid collections within in the right lower abdominal quadrant. Please perform CT-guided abscess catheter drainage placement  EXAM: 1. CT IMAGE GUIDED DRAINAGE BY PERCUTANEOUS CATHETER 2. CT-GUIDED ASPIRATION  COMPARISON:  CT abdomen pelvis - 07/05/2014  MEDICATIONS: The patient is currently admitted to the hospital and receiving intravenous antibiotics. The antibiotics were administered within an appropriate time frame prior to the initiation of the procedure.  ANESTHESIA/SEDATION: Fentanyl 100  mcg IV; Versed 2 mg IV  Total Moderate Sedation time  45 minutes  CONTRAST:  None  COMPLICATIONS: None immediate  PROCEDURE: Informed written consent was obtained from the patient after a discussion of the risks, benefits and alternatives to treatment. The patient was placed supine on the CT gantry and a pre procedural CT was performed  re-demonstrating the known abscess/fluid collection within the anterior aspect of the right lower abdomen measuring approximately 3.2 x 3.2 cm (image 28, series 2) as well as a additional approximately 4.2 x 2.9 cm fluid collection within the midline of the right lower abdomen (image 23, series 2).  Attention was first paid towards percutaneous drainage of the more superficial fluid collection within the right lower abdominal quadrant. The procedure was planned. A timeout was performed prior to the initiation of the procedure.  The skin overlying the right lower abdomen was prepped and draped in the usual sterile fashion. The overlying soft tissues were anesthetized with 1% lidocaine with epinephrine. Appropriate trajectory was planned with the use of a 22 gauge spinal needle. An 18 gauge trocar needle was advanced into the abscess/fluid collection. Appropriate positioning was confirmed (representative image 1, series 6) and approximately 5 cc of slightly blood tinged serous fluid was aspirated.  A short Amplatz super stiff wire was coiled within the collection (series 7). Appropriate positioning was confirmed with a limited CT scan. The tract was serially dilated allowing placement of a 10 French all-purpose drainage catheter, however the percutaneous drainage catheter would not coil within the fluid collection. The percutaneous drainage catheter was ultimately retracted and removed.  --------------------------------------------------------  Attention was now  paid towards the more well organized approximately 4.2 cm fluid collection within the midline of the right lower abdomen. The overlying soft tissues were anesthetized with 1% lidocaine with epinephrine. Appropriate trajectory was planned with the use of a 22 gauge spinal needle. An 18 gauge trocar needle was advanced into the abscess/fluid collection however given concern for the initial trocar needle's proximity to an adjacent loop of small bowel, an  additional 18 gauge trocar needle was utilized to access the complex fluid collection from a more caudal location utilizing a more caudal-cranial trajectory. Appropriate positioning was confirmed with a limited postprocedural CT scan.  A short Amplatz superstiff wire was coiled within the collection (series 21). Appropriate position was confirmed with a limited CT scan. The tract was serially dilated allowing placement of a 10 [place] all-purpose drainage catheter. Approximately 25 cc of purulent material was aspirated following percutaneous drainage catheter placement. The tube was connected to a drainage bag and sutured in place. A dressing was placed. The patient tolerated the procedure well without immediate post procedural complication.  IMPRESSION: 1. Successful CT guided placement of a 10 [place] all purpose drain catheter into the midline of the abdomen with aspiration of 25 mL of purulent fluid. Samples were sent to the laboratory as requested by the ordering clinical team. 2. Successful CT-guided aspiration of approximately 5 cc of slightly blood tinged serous fluid from the more superficially located fluid collection within the right lower abdominal quadrant. Note, a percutaneous drainage catheter would not form within this more superficial fluid collection.   Electronically Signed   By: Sandi Mariscal Lamb.D.   On: 07/06/2014 15:48   Ct Image Guided Drainage By Percutaneous Catheter  07/06/2014   INDICATION: History of ruptured appendicitis, now with multiple peripherally enhancing fluid collections within in the right lower abdominal quadrant. Please perform CT-guided abscess catheter drainage placement  EXAM: 1. CT IMAGE GUIDED DRAINAGE BY PERCUTANEOUS CATHETER 2. CT-GUIDED ASPIRATION  COMPARISON:  CT abdomen pelvis - 07/05/2014  MEDICATIONS: The patient is currently admitted to the hospital and receiving intravenous antibiotics. The antibiotics were administered within an appropriate time frame prior to  the initiation of the procedure.  ANESTHESIA/SEDATION: Fentanyl 100  mcg IV; Versed 2 mg IV  Total Moderate Sedation time  45 minutes  CONTRAST:  None  COMPLICATIONS: None immediate  PROCEDURE: Informed written consent was obtained from the patient after a discussion of the risks, benefits and alternatives to treatment. The patient was placed supine on the CT gantry and a pre procedural CT was performed re-demonstrating the known abscess/fluid collection within the anterior aspect of the right lower abdomen measuring approximately 3.2 x 3.2 cm (image 28, series 2) as well as a additional approximately 4.2 x 2.9 cm fluid collection within the midline of the right lower abdomen (image 23, series 2).  Attention was first paid towards percutaneous drainage of the more superficial fluid collection within the right lower abdominal quadrant. The procedure was planned. A timeout was performed prior to the initiation of the procedure.  The skin overlying the right lower abdomen was prepped and draped in the usual sterile fashion. The overlying soft tissues were anesthetized with 1% lidocaine with epinephrine. Appropriate trajectory was planned with the use of a 22 gauge spinal needle. An 18 gauge trocar needle was advanced into the abscess/fluid collection. Appropriate positioning was confirmed (representative image 1, series 6) and approximately 5 cc of slightly blood tinged serous fluid was aspirated.  A short Amplatz super stiff wire was coiled  within the collection (series 7). Appropriate positioning was confirmed with a limited CT scan. The tract was serially dilated allowing placement of a 10 French all-purpose drainage catheter, however the percutaneous drainage catheter would not coil within the fluid collection. The percutaneous drainage catheter was ultimately retracted and removed.  --------------------------------------------------------  Attention was now paid towards the more well organized approximately 4.2 cm  fluid collection within the midline of the right lower abdomen. The overlying soft tissues were anesthetized with 1% lidocaine with epinephrine. Appropriate trajectory was planned with the use of a 22 gauge spinal needle. An 18 gauge trocar needle was advanced into the abscess/fluid collection however given concern for the initial trocar needle's proximity to an adjacent loop of small bowel, an additional 18 gauge trocar needle was utilized to access the complex fluid collection from a more caudal location utilizing a more caudal-cranial trajectory. Appropriate positioning was confirmed with a limited postprocedural CT scan.  A short Amplatz superstiff wire was coiled within the collection (series 21). Appropriate position was confirmed with a limited CT scan. The tract was serially dilated allowing placement of a 10 [place] all-purpose drainage catheter. Approximately 25 cc of purulent material was aspirated following percutaneous drainage catheter placement. The tube was connected to a drainage bag and sutured in place. A dressing was placed. The patient tolerated the procedure well without immediate post procedural complication.  IMPRESSION: 1. Successful CT guided placement of a 10 [place] all purpose drain catheter into the midline of the abdomen with aspiration of 25 mL of purulent fluid. Samples were sent to the laboratory as requested by the ordering clinical team. 2. Successful CT-guided aspiration of approximately 5 cc of slightly blood tinged serous fluid from the more superficially located fluid collection within the right lower abdominal quadrant. Note, a percutaneous drainage catheter would not form within this more superficial fluid collection.   Electronically Signed   By: Sandi Mariscal Lamb.D.   On: 07/06/2014 15:48    Anti-infectives: Anti-infectives    Start     Dose/Rate Route Frequency Ordered Stop   07/06/14 0400  piperacillin-tazobactam (ZOSYN) IVPB 3.375 g     3.375 g12.5 mL/hr over 240  Minutes Intravenous 3 times per day 07/05/14 1839     07/05/14 2200  piperacillin-tazobactam (ZOSYN) IVPB 3.375 g  Status:  Discontinued     3.375 g12.5 mL/hr over 240 Minutes Intravenous 3 times per day 07/05/14 1822 07/05/14 1839   07/05/14 1845  piperacillin-tazobactam (ZOSYN) IVPB 3.375 g  Status:  Discontinued     3.375 g12.5 mL/hr over 240 Minutes Intravenous NOW 07/05/14 1836 07/05/14 1836   07/05/14 1845  piperacillin-tazobactam (ZOSYN) IVPB 3.375 g     3.375 g100 mL/hr over 30 Minutes Intravenous NOW 07/05/14 1837 07/05/14 1925      Assessment/Plan:  Ruptured appendicitis with 2 adjacent abscesses. Diverticulitis less likely, but not ruled out. Delayed presentation after 1 week of symptoms. Stable on antibiotics and one day following percutaneous drainage Allow clear liquid diet IV Zosyn for now Check cultures. Mobilize Resume subcutaneous heparin for DVT prophylaxis  Tobacco abuse. Encouraged incentive spirometry. Stressed that she should stop smoking.   LOS: 2 days    Kelli Lamb 07/07/2014

## 2014-07-07 NOTE — Progress Notes (Signed)
Referring Physician(s): CCS  Subjective: Patient c/o pain at drain site.   Allergies: Review of patient's allergies indicates no known allergies.  Medications: Prior to Admission medications   Medication Sig Start Date End Date Taking? Authorizing Provider  albuterol (PROVENTIL HFA;VENTOLIN HFA) 108 (90 BASE) MCG/ACT inhaler Inhale 1 puff into the lungs every 4 (four) hours as needed for wheezing or shortness of breath.   Yes Historical Provider, MD  ciprofloxacin (CIPRO) 500 MG tablet Take 500 mg by mouth 2 (two) times daily. She is taking for 7 days. She started on 06/30/14 and has not completed. 06/30/14  Yes Historical Provider, MD  HYDROMET 5-1.5 MG/5ML syrup Take 5 mLs by mouth every 6 (six) hours as needed for cough.  06/16/14  Yes Historical Provider, MD  lactulose (CHRONULAC) 10 GM/15ML solution  06/30/14  Yes Historical Provider, MD  montelukast (SINGULAIR) 10 MG tablet Take 10 mg by mouth daily as needed (For allergies.).  04/15/14  Yes Historical Provider, MD  zolpidem (AMBIEN) 10 MG tablet Take 10 mg by mouth at bedtime.  06/23/14  Yes Historical Provider, MD    Review of Systems  Vital Signs: BP 103/67 mmHg  Pulse 90  Temp(Src) 98.9 F (37.2 C) (Oral)  Resp 18  Ht 5\' 4"  (1.626 m)  Wt 163 lb 9.3 oz (74.2 kg)  BMI 28.06 kg/m2  SpO2 91%  LMP 06/11/2014 (Exact Date)  Physical Exam General: A&Ox3, NAD Abd: Lower pelvic drain intact, tender with thick bloody output, 30 cc out so far, Cx no growth  Imaging: Ct Abdomen Pelvis W Contrast  07/05/2014   CLINICAL DATA:  One week history of fever abdominal pain. Elevated white blood cell count  EXAM: CT ABDOMEN AND PELVIS WITH CONTRAST  TECHNIQUE: Multidetector CT imaging of the abdomen and pelvis was performed using the standard protocol following bolus administration of intravenous contrast. Oral contrast was also administered.  CONTRAST:  101mL OMNIPAQUE IOHEXOL 300 MG/ML SOLN, 177mL OMNIPAQUE IOHEXOL 300 MG/ML SOLN   COMPARISON:  None.  FINDINGS: There is mild atelectasis in the right middle lobe. Lung bases are otherwise clear.  Liver is enlarged, measuring 21.8 cm in length. No focal liver lesions are identified. Gallbladder wall is not appreciably thickened. There is no biliary duct dilatation.  Spleen, pancreas, and right adrenal appear normal. There is a left adrenal adenoma measuring 2.6 x 1.7 cm.  Kidneys bilaterally show no mass or hydronephrosis on either side. There is no renal or ureteral calculus on either side.  In the pelvis, the urinary bladder wall is thickened. There is no pelvic mass. There is a small amount of free fluid in the cul-de-sac. There is extensive inflammation throughout the pelvis with wall thickening as well as thickening of multiple loops of bowel in the pelvis. There is a developing abscess immediately adjacent to the cecum in the mid pelvis measuring 5.1 x 3.6 x 2.5 cm. This finding is most likely due to ruptured appendix with developing periappendiceal region abscess. There is wall thickening in the adjacent cecum as well as thickening of the wall of the terminal ileum, likely due to inflammation from the periappendiceal region abscess. A second developing abscess is noted slightly superior to the urinary bladder in the right mid pelvis measuring 5.5 x 3.5 x 2.9 cm. The uterus appears somewhat enlarged. The uterus may well be edematous from the surrounding inflammation.  There is no appreciable bowel obstruction. There is no free air or portal venous air.  No adenopathy is identified  in the abdomen or pelvis. Aorta is nonaneurysmal. There are no blastic or lytic bone lesions.  IMPRESSION: Findings consistent with periappendiceal abscess, likely due to appendiceal rupture. This periappendiceal abscess measures 5.1 x 3.6 x 2.5 cm. A second developing abscess is noted slightly superior to the urinary bladder on the right measuring 5.5 x 3.5 x 2.9 cm. There is extensive inflammation throughout the  pelvis with wall thickening in the urinary bladder likely due to the nearby abscess as well as multiple loops of bowel with thickened walls and surrounding mesenteric inflammation. Small amount of fluid in the cul-de-sac is likely due to the inflammation from the periappendiceal abscesses as well.  Uterus is prominent, likely due at least in part to edema from the surrounding inflammation. Leiomyomatous change in the uterus cannot be excluded, however.  Liver enlarged without focal lesion.  There is a left adrenal adenoma, a benign finding.  Critical Value/emergent results were called by telephone at the time of interpretation on 07/05/2014 at 5:08 pm to [facility] , who verbally acknowledged these results.   Electronically Signed   By: Lowella Grip M.D.   On: 07/05/2014 17:10   Ct Aspiration  07/06/2014   INDICATION: History of ruptured appendicitis, now with multiple peripherally enhancing fluid collections within in the right lower abdominal quadrant. Please perform CT-guided abscess catheter drainage placement  EXAM: 1. CT IMAGE GUIDED DRAINAGE BY PERCUTANEOUS CATHETER 2. CT-GUIDED ASPIRATION  COMPARISON:  CT abdomen pelvis - 07/05/2014  MEDICATIONS: The patient is currently admitted to the hospital and receiving intravenous antibiotics. The antibiotics were administered within an appropriate time frame prior to the initiation of the procedure.  ANESTHESIA/SEDATION: Fentanyl 100  mcg IV; Versed 2 mg IV  Total Moderate Sedation time  45 minutes  CONTRAST:  None  COMPLICATIONS: None immediate  PROCEDURE: Informed written consent was obtained from the patient after a discussion of the risks, benefits and alternatives to treatment. The patient was placed supine on the CT gantry and a pre procedural CT was performed re-demonstrating the known abscess/fluid collection within the anterior aspect of the right lower abdomen measuring approximately 3.2 x 3.2 cm (image 28, series 2) as well as a  additional approximately 4.2 x 2.9 cm fluid collection within the midline of the right lower abdomen (image 23, series 2).  Attention was first paid towards percutaneous drainage of the more superficial fluid collection within the right lower abdominal quadrant. The procedure was planned. A timeout was performed prior to the initiation of the procedure.  The skin overlying the right lower abdomen was prepped and draped in the usual sterile fashion. The overlying soft tissues were anesthetized with 1% lidocaine with epinephrine. Appropriate trajectory was planned with the use of a 22 gauge spinal needle. An 18 gauge trocar needle was advanced into the abscess/fluid collection. Appropriate positioning was confirmed (representative image 1, series 6) and approximately 5 cc of slightly blood tinged serous fluid was aspirated.  A short Amplatz super stiff wire was coiled within the collection (series 7). Appropriate positioning was confirmed with a limited CT scan. The tract was serially dilated allowing placement of a 10 French all-purpose drainage catheter, however the percutaneous drainage catheter would not coil within the fluid collection. The percutaneous drainage catheter was ultimately retracted and removed.  --------------------------------------------------------  Attention was now paid towards the more well organized approximately 4.2 cm fluid collection within the midline of the right lower abdomen. The overlying soft tissues were anesthetized with 1% lidocaine with epinephrine. Appropriate  trajectory was planned with the use of a 22 gauge spinal needle. An 18 gauge trocar needle was advanced into the abscess/fluid collection however given concern for the initial trocar needle's proximity to an adjacent loop of small bowel, an additional 18 gauge trocar needle was utilized to access the complex fluid collection from a more caudal location utilizing a more caudal-cranial trajectory. Appropriate positioning was  confirmed with a limited postprocedural CT scan.  A short Amplatz superstiff wire was coiled within the collection (series 21). Appropriate position was confirmed with a limited CT scan. The tract was serially dilated allowing placement of a 10 [place] all-purpose drainage catheter. Approximately 25 cc of purulent material was aspirated following percutaneous drainage catheter placement. The tube was connected to a drainage bag and sutured in place. A dressing was placed. The patient tolerated the procedure well without immediate post procedural complication.  IMPRESSION: 1. Successful CT guided placement of a 10 [place] all purpose drain catheter into the midline of the abdomen with aspiration of 25 mL of purulent fluid. Samples were sent to the laboratory as requested by the ordering clinical team. 2. Successful CT-guided aspiration of approximately 5 cc of slightly blood tinged serous fluid from the more superficially located fluid collection within the right lower abdominal quadrant. Note, a percutaneous drainage catheter would not form within this more superficial fluid collection.   Electronically Signed   By: Sandi Mariscal M.D.   On: 07/06/2014 15:48   Ct Image Guided Drainage By Percutaneous Catheter  07/06/2014   INDICATION: History of ruptured appendicitis, now with multiple peripherally enhancing fluid collections within in the right lower abdominal quadrant. Please perform CT-guided abscess catheter drainage placement  EXAM: 1. CT IMAGE GUIDED DRAINAGE BY PERCUTANEOUS CATHETER 2. CT-GUIDED ASPIRATION  COMPARISON:  CT abdomen pelvis - 07/05/2014  MEDICATIONS: The patient is currently admitted to the hospital and receiving intravenous antibiotics. The antibiotics were administered within an appropriate time frame prior to the initiation of the procedure.  ANESTHESIA/SEDATION: Fentanyl 100  mcg IV; Versed 2 mg IV  Total Moderate Sedation time  45 minutes  CONTRAST:  None  COMPLICATIONS: None immediate   PROCEDURE: Informed written consent was obtained from the patient after a discussion of the risks, benefits and alternatives to treatment. The patient was placed supine on the CT gantry and a pre procedural CT was performed re-demonstrating the known abscess/fluid collection within the anterior aspect of the right lower abdomen measuring approximately 3.2 x 3.2 cm (image 28, series 2) as well as a additional approximately 4.2 x 2.9 cm fluid collection within the midline of the right lower abdomen (image 23, series 2).  Attention was first paid towards percutaneous drainage of the more superficial fluid collection within the right lower abdominal quadrant. The procedure was planned. A timeout was performed prior to the initiation of the procedure.  The skin overlying the right lower abdomen was prepped and draped in the usual sterile fashion. The overlying soft tissues were anesthetized with 1% lidocaine with epinephrine. Appropriate trajectory was planned with the use of a 22 gauge spinal needle. An 18 gauge trocar needle was advanced into the abscess/fluid collection. Appropriate positioning was confirmed (representative image 1, series 6) and approximately 5 cc of slightly blood tinged serous fluid was aspirated.  A short Amplatz super stiff wire was coiled within the collection (series 7). Appropriate positioning was confirmed with a limited CT scan. The tract was serially dilated allowing placement of a 10 French all-purpose drainage catheter, however the percutaneous  drainage catheter would not coil within the fluid collection. The percutaneous drainage catheter was ultimately retracted and removed.  --------------------------------------------------------  Attention was now paid towards the more well organized approximately 4.2 cm fluid collection within the midline of the right lower abdomen. The overlying soft tissues were anesthetized with 1% lidocaine with epinephrine. Appropriate trajectory was planned  with the use of a 22 gauge spinal needle. An 18 gauge trocar needle was advanced into the abscess/fluid collection however given concern for the initial trocar needle's proximity to an adjacent loop of small bowel, an additional 18 gauge trocar needle was utilized to access the complex fluid collection from a more caudal location utilizing a more caudal-cranial trajectory. Appropriate positioning was confirmed with a limited postprocedural CT scan.  A short Amplatz superstiff wire was coiled within the collection (series 21). Appropriate position was confirmed with a limited CT scan. The tract was serially dilated allowing placement of a 10 [place] all-purpose drainage catheter. Approximately 25 cc of purulent material was aspirated following percutaneous drainage catheter placement. The tube was connected to a drainage bag and sutured in place. A dressing was placed. The patient tolerated the procedure well without immediate post procedural complication.  IMPRESSION: 1. Successful CT guided placement of a 10 [place] all purpose drain catheter into the midline of the abdomen with aspiration of 25 mL of purulent fluid. Samples were sent to the laboratory as requested by the ordering clinical team. 2. Successful CT-guided aspiration of approximately 5 cc of slightly blood tinged serous fluid from the more superficially located fluid collection within the right lower abdominal quadrant. Note, a percutaneous drainage catheter would not form within this more superficial fluid collection.   Electronically Signed   By: Sandi Mariscal M.D.   On: 07/06/2014 15:48    Labs:  CBC:  Recent Labs  07/05/14 1441 07/06/14 0513  WBC 11.5* 12.4*  HGB 12.3 10.7*  HCT 38.7 33.9*  PLT 320 293    COAGS:  Recent Labs  07/06/14 0513  INR 1.16  APTT 31    BMP:  Recent Labs  07/05/14 1441 07/06/14 0513  NA 136* 136  K 4.0 4.0  CL 96 103  CO2 26 24  GLUCOSE 97 102*  BUN 10 7  CALCIUM 9.2 8.2*  CREATININE 0.53  0.49*  GFRNONAA >90 >90  GFRAA >90 >90    LIVER FUNCTION TESTS:  Recent Labs  07/05/14 1441  BILITOT 0.3  AST 15  ALT 56*  ALKPHOS 153*  PROT 7.2  ALBUMIN 2.9*    Assessment and Plan: Likely ruptured appendicitis with abscess S/p drain placed in IR 12/22 Cx no growth and RLQ collection aspirated 5c c serous fluid Continue flushes and monitor daily output Plans per [facility]CS    I spent a total of 15 minutes face to face in clinical consultation/evaluation, greater than 50% of which was counseling/coordinating care  Signed: Hedy Jacob 07/07/2014, 1:33 PM

## 2014-07-08 LAB — CBC
HCT: 30.5 % — ABNORMAL LOW (ref 36.0–46.0)
HEMOGLOBIN: 9.8 g/dL — AB (ref 12.0–15.0)
MCH: 28.7 pg (ref 26.0–34.0)
MCHC: 32.1 g/dL (ref 30.0–36.0)
MCV: 89.4 fL (ref 78.0–100.0)
PLATELETS: 295 10*3/uL (ref 150–400)
RBC: 3.41 MIL/uL — AB (ref 3.87–5.11)
RDW: 13.9 % (ref 11.5–15.5)
WBC: 9.5 10*3/uL (ref 4.0–10.5)

## 2014-07-08 MED ORDER — OXYCODONE HCL 5 MG PO TABS
5.0000 mg | ORAL_TABLET | ORAL | Status: DC | PRN
  Administered 2014-07-08 – 2014-07-10 (×6): 10 mg via ORAL
  Filled 2014-07-08 (×6): qty 2

## 2014-07-08 MED ORDER — HYDROMORPHONE HCL 1 MG/ML IJ SOLN
0.5000 mg | INTRAMUSCULAR | Status: DC | PRN
  Administered 2014-07-08 – 2014-07-09 (×2): 2 mg via INTRAVENOUS
  Filled 2014-07-08 (×2): qty 2

## 2014-07-08 MED ORDER — ACETAMINOPHEN 500 MG PO TABS
1000.0000 mg | ORAL_TABLET | Freq: Three times a day (TID) | ORAL | Status: DC
  Administered 2014-07-08 – 2014-07-10 (×6): 1000 mg via ORAL
  Filled 2014-07-08 (×8): qty 2

## 2014-07-08 NOTE — Progress Notes (Signed)
Referring Physician(s): CCS  Subjective: Patient states lower pelvic drain site pain is controlled with pain medication.   Allergies: Review of patient's allergies indicates no known allergies.  Medications: Prior to Admission medications   Medication Sig Start Date End Date Taking? Authorizing Provider  albuterol (PROVENTIL HFA;VENTOLIN HFA) 108 (90 BASE) MCG/ACT inhaler Inhale 1 puff into the lungs every 4 (four) hours as needed for wheezing or shortness of breath.   Yes Historical Provider, MD  ciprofloxacin (CIPRO) 500 MG tablet Take 500 mg by mouth 2 (two) times daily. She is taking for 7 days. She started on 06/30/14 and has not completed. 06/30/14  Yes Historical Provider, MD  HYDROMET 5-1.5 MG/5ML syrup Take 5 mLs by mouth every 6 (six) hours as needed for cough.  06/16/14  Yes Historical Provider, MD  lactulose (CHRONULAC) 10 GM/15ML solution  06/30/14  Yes Historical Provider, MD  montelukast (SINGULAIR) 10 MG tablet Take 10 mg by mouth daily as needed (For allergies.).  04/15/14  Yes Historical Provider, MD  zolpidem (AMBIEN) 10 MG tablet Take 10 mg by mouth at bedtime.  06/23/14  Yes Historical Provider, MD    Review of Systems  Vital Signs: BP 94/59 mmHg  Pulse 62  Temp(Src) 98.2 F (36.8 C) (Oral)  Resp 16  Ht 5\' 4"  (1.626 m)  Wt 163 lb 9.3 oz (74.2 kg)  BMI 28.06 kg/m2  SpO2 94%  LMP 06/11/2014 (Exact Date)  Physical Exam General: A&Ox3, NAD Abd: Soft, minimal tenderness at drain site, thin brown output in bag, 24 hr output 30cc  Imaging: Ct Abdomen Pelvis W Contrast  07/05/2014   CLINICAL DATA:  One week history of fever abdominal pain. Elevated white blood cell count  EXAM: CT ABDOMEN AND PELVIS WITH CONTRAST  TECHNIQUE: Multidetector CT imaging of the abdomen and pelvis was performed using the standard protocol following bolus administration of intravenous contrast. Oral contrast was also administered.  CONTRAST:  22mL OMNIPAQUE IOHEXOL 300 MG/ML SOLN,  177mL OMNIPAQUE IOHEXOL 300 MG/ML SOLN  COMPARISON:  None.  FINDINGS: There is mild atelectasis in the right middle lobe. Lung bases are otherwise clear.  Liver is enlarged, measuring 21.8 cm in length. No focal liver lesions are identified. Gallbladder wall is not appreciably thickened. There is no biliary duct dilatation.  Spleen, pancreas, and right adrenal appear normal. There is a left adrenal adenoma measuring 2.6 x 1.7 cm.  Kidneys bilaterally show no mass or hydronephrosis on either side. There is no renal or ureteral calculus on either side.  In the pelvis, the urinary bladder wall is thickened. There is no pelvic mass. There is a small amount of free fluid in the cul-de-sac. There is extensive inflammation throughout the pelvis with wall thickening as well as thickening of multiple loops of bowel in the pelvis. There is a developing abscess immediately adjacent to the cecum in the mid pelvis measuring 5.1 x 3.6 x 2.5 cm. This finding is most likely due to ruptured appendix with developing periappendiceal region abscess. There is wall thickening in the adjacent cecum as well as thickening of the wall of the terminal ileum, likely due to inflammation from the periappendiceal region abscess. A second developing abscess is noted slightly superior to the urinary bladder in the right mid pelvis measuring 5.5 x 3.5 x 2.9 cm. The uterus appears somewhat enlarged. The uterus may well be edematous from the surrounding inflammation.  There is no appreciable bowel obstruction. There is no free air or portal venous air.  No adenopathy is identified in the abdomen or pelvis. Aorta is nonaneurysmal. There are no blastic or lytic bone lesions.  IMPRESSION: Findings consistent with periappendiceal abscess, likely due to appendiceal rupture. This periappendiceal abscess measures 5.1 x 3.6 x 2.5 cm. A second developing abscess is noted slightly superior to the urinary bladder on the right measuring 5.5 x 3.5 x 2.9 cm. There  is extensive inflammation throughout the pelvis with wall thickening in the urinary bladder likely due to the nearby abscess as well as multiple loops of bowel with thickened walls and surrounding mesenteric inflammation. Small amount of fluid in the cul-de-sac is likely due to the inflammation from the periappendiceal abscesses as well.  Uterus is prominent, likely due at least in part to edema from the surrounding inflammation. Leiomyomatous change in the uterus cannot be excluded, however.  Liver enlarged without focal lesion.  There is a left adrenal adenoma, a benign finding.  Critical Value/emergent results were called by telephone at the time of interpretation on 07/05/2014 at 5:08 pm to [facility] [place] [place] , who verbally acknowledged these results.   Electronically Signed   By: Lowella Grip M.D.   On: 07/05/2014 17:10   Ct Aspiration  07/06/2014   INDICATION: History of ruptured appendicitis, now with multiple peripherally enhancing fluid collections within in the right lower abdominal quadrant. Please perform CT-guided abscess catheter drainage placement  EXAM: 1. CT IMAGE GUIDED DRAINAGE BY PERCUTANEOUS CATHETER 2. CT-GUIDED ASPIRATION  COMPARISON:  CT abdomen pelvis - 07/05/2014  MEDICATIONS: The patient is currently admitted to the hospital and receiving intravenous antibiotics. The antibiotics were administered within an appropriate time frame prior to the initiation of the procedure.  ANESTHESIA/SEDATION: Fentanyl 100  mcg IV; Versed 2 mg IV  Total Moderate Sedation time  45 minutes  CONTRAST:  None  COMPLICATIONS: None immediate  PROCEDURE: Informed written consent was obtained from the patient after a discussion of the risks, benefits and alternatives to treatment. The patient was placed supine on the CT gantry and a pre procedural CT was performed re-demonstrating the known abscess/fluid collection within the anterior aspect of the right lower abdomen measuring approximately 3.2 x 3.2 cm  (image 28, series 2) as well as a additional approximately 4.2 x 2.9 cm fluid collection within the midline of the right lower abdomen (image 23, series 2).  Attention was first paid towards percutaneous drainage of the more superficial fluid collection within the right lower abdominal quadrant. The procedure was planned. A timeout was performed prior to the initiation of the procedure.  The skin overlying the right lower abdomen was prepped and draped in the usual sterile fashion. The overlying soft tissues were anesthetized with 1% lidocaine with epinephrine. Appropriate trajectory was planned with the use of a 22 gauge spinal needle. An 18 gauge trocar needle was advanced into the abscess/fluid collection. Appropriate positioning was confirmed (representative image 1, series 6) and approximately 5 cc of slightly blood tinged serous fluid was aspirated.  A short Amplatz super stiff wire was coiled within the collection (series 7). Appropriate positioning was confirmed with a limited CT scan. The tract was serially dilated allowing placement of a 10 [place] all-purpose drainage catheter, however the percutaneous drainage catheter would not coil within the fluid collection. The percutaneous drainage catheter was ultimately retracted and removed.  --------------------------------------------------------  Attention was now paid towards the more well organized approximately 4.2 cm fluid collection within the midline of the right lower abdomen. The overlying soft tissues were anesthetized with 1%  lidocaine with epinephrine. Appropriate trajectory was planned with the use of a 22 gauge spinal needle. An 18 gauge trocar needle was advanced into the abscess/fluid collection however given concern for the initial trocar needle's proximity to an adjacent loop of small bowel, an additional 18 gauge trocar needle was utilized to access the complex fluid collection from a more caudal location utilizing a more caudal-cranial  trajectory. Appropriate positioning was confirmed with a limited postprocedural CT scan.  A short Amplatz superstiff wire was coiled within the collection (series 21). Appropriate position was confirmed with a limited CT scan. The tract was serially dilated allowing placement of a 10 [place] all-purpose drainage catheter. Approximately 25 cc of purulent material was aspirated following percutaneous drainage catheter placement. The tube was connected to a drainage bag and sutured in place. A dressing was placed. The patient tolerated the procedure well without immediate post procedural complication.  IMPRESSION: 1. Successful CT guided placement of a 10 [place] all purpose drain catheter into the midline of the abdomen with aspiration of 25 mL of purulent fluid. Samples were sent to the laboratory as requested by the ordering clinical team. 2. Successful CT-guided aspiration of approximately 5 cc of slightly blood tinged serous fluid from the more superficially located fluid collection within the right lower abdominal quadrant. Note, a percutaneous drainage catheter would not form within this more superficial fluid collection.   Electronically Signed   By: Sandi Mariscal M.D.   On: 07/06/2014 15:48   Ct Image Guided Drainage By Percutaneous Catheter  07/06/2014   INDICATION: History of ruptured appendicitis, now with multiple peripherally enhancing fluid collections within in the right lower abdominal quadrant. Please perform CT-guided abscess catheter drainage placement  EXAM: 1. CT IMAGE GUIDED DRAINAGE BY PERCUTANEOUS CATHETER 2. CT-GUIDED ASPIRATION  COMPARISON:  CT abdomen pelvis - 07/05/2014  MEDICATIONS: The patient is currently admitted to the hospital and receiving intravenous antibiotics. The antibiotics were administered within an appropriate time frame prior to the initiation of the procedure.  ANESTHESIA/SEDATION: Fentanyl 100  mcg IV; Versed 2 mg IV  Total Moderate Sedation time  45 minutes  CONTRAST:   None  COMPLICATIONS: None immediate  PROCEDURE: Informed written consent was obtained from the patient after a discussion of the risks, benefits and alternatives to treatment. The patient was placed supine on the CT gantry and a pre procedural CT was performed re-demonstrating the known abscess/fluid collection within the anterior aspect of the right lower abdomen measuring approximately 3.2 x 3.2 cm (image 28, series 2) as well as a additional approximately 4.2 x 2.9 cm fluid collection within the midline of the right lower abdomen (image 23, series 2).  Attention was first paid towards percutaneous drainage of the more superficial fluid collection within the right lower abdominal quadrant. The procedure was planned. A timeout was performed prior to the initiation of the procedure.  The skin overlying the right lower abdomen was prepped and draped in the usual sterile fashion. The overlying soft tissues were anesthetized with 1% lidocaine with epinephrine. Appropriate trajectory was planned with the use of a 22 gauge spinal needle. An 18 gauge trocar needle was advanced into the abscess/fluid collection. Appropriate positioning was confirmed (representative image 1, series 6) and approximately 5 cc of slightly blood tinged serous fluid was aspirated.  A short Amplatz super stiff wire was coiled within the collection (series 7). Appropriate positioning was confirmed with a limited CT scan. The tract was serially dilated allowing placement of a 10 [place] all-purpose drainage  catheter, however the percutaneous drainage catheter would not coil within the fluid collection. The percutaneous drainage catheter was ultimately retracted and removed.  --------------------------------------------------------  Attention was now paid towards the more well organized approximately 4.2 cm fluid collection within the midline of the right lower abdomen. The overlying soft tissues were anesthetized with 1% lidocaine with epinephrine.  Appropriate trajectory was planned with the use of a 22 gauge spinal needle. An 18 gauge trocar needle was advanced into the abscess/fluid collection however given concern for the initial trocar needle's proximity to an adjacent loop of small bowel, an additional 18 gauge trocar needle was utilized to access the complex fluid collection from a more caudal location utilizing a more caudal-cranial trajectory. Appropriate positioning was confirmed with a limited postprocedural CT scan.  A short Amplatz superstiff wire was coiled within the collection (series 21). Appropriate position was confirmed with a limited CT scan. The tract was serially dilated allowing placement of a 10 [place] all-purpose drainage catheter. Approximately 25 cc of purulent material was aspirated following percutaneous drainage catheter placement. The tube was connected to a drainage bag and sutured in place. A dressing was placed. The patient tolerated the procedure well without immediate post procedural complication.  IMPRESSION: 1. Successful CT guided placement of a 10 [place] all purpose drain catheter into the midline of the abdomen with aspiration of 25 mL of purulent fluid. Samples were sent to the laboratory as requested by the ordering clinical team. 2. Successful CT-guided aspiration of approximately 5 cc of slightly blood tinged serous fluid from the more superficially located fluid collection within the right lower abdominal quadrant. Note, a percutaneous drainage catheter would not form within this more superficial fluid collection.   Electronically Signed   By: Sandi Mariscal M.D.   On: 07/06/2014 15:48    Labs:  CBC:  Recent Labs  07/05/14 1441 07/06/14 0513 07/08/14 0010  WBC 11.5* 12.4* 9.5  HGB 12.3 10.7* 9.8*  HCT 38.7 33.9* 30.5*  PLT 320 293 295    COAGS:  Recent Labs  07/06/14 0513  INR 1.16  APTT 31    BMP:  Recent Labs  07/05/14 1441 07/06/14 0513  NA 136* 136  K 4.0 4.0  CL 96 103  CO2 26  24  GLUCOSE 97 102*  BUN 10 7  CALCIUM 9.2 8.2*  CREATININE 0.53 0.49*  GFRNONAA >90 >90  GFRAA >90 >90    LIVER FUNCTION TESTS:  Recent Labs  07/05/14 1441  BILITOT 0.3  AST 15  ALT 56*  ALKPHOS 153*  PROT 7.2  ALBUMIN 2.9*    Assessment and Plan: Likely ruptured appendicitis with abscess, wbc wnl, afebrile S/p drain placed in IR 12/22 Cx no growth and RLQ collection aspirated 5c c serous fluid Continue flushes and monitor daily output Plans per CCS    I spent a total of 15 minutes face to face in clinical consultation/evaluation, greater than 50% of which was counseling/coordinating care   Signed: Hedy Jacob 07/08/2014, 11:36 AM

## 2014-07-08 NOTE — Progress Notes (Signed)
Subjective: She is still sore, but less than on admit.  Drain bag has purulent fluid in it, top of the tubing is clear right now..  She is tolerating clears and asking about full liquids. Walking some but that is still very painful.   Objective: Vital signs in last 24 hours: Temp:  [98.1 F (36.7 C)-98.8 F (37.1 C)] 98.2 F (36.8 C) (12/24 0513) Pulse Rate:  [61-82] 62 (12/24 0513) Resp:  [16-18] 16 (12/24 0513) BP: (94-114)/(59-70) 94/59 mmHg (12/24 0513) SpO2:  [91 %-97 %] 91 % (12/24 0513) Last BM Date: 07/04/14  Intake/Output from previous day: 12/23 0701 - 12/24 0700 In: 310 [P.O.:300] Out: 30 [Drains:30] Intake/Output this shift:    General appearance: alert, cooperative and no distress Resp: clear to auscultation bilaterally GI: soft, still tender, purulent drainage in the drain bag.  few BS,   Lab Results:   Recent Labs  07/06/14 0513 07/08/14 0010  WBC 12.4* 9.5  HGB 10.7* 9.8*  HCT 33.9* 30.5*  PLT 293 295    BMET  Recent Labs  07/05/14 1441 07/06/14 0513  NA 136* 136  K 4.0 4.0  CL 96 103  CO2 26 24  GLUCOSE 97 102*  BUN 10 7  CREATININE 0.53 0.49*  CALCIUM 9.2 8.2*   PT/INR  Recent Labs  07/06/14 0513  LABPROT 14.9  INR 1.16     Recent Labs Lab 07/05/14 1441  AST 15  ALT 56*  ALKPHOS 153*  BILITOT 0.3  PROT 7.2  ALBUMIN 2.9*     Lipase     Component Value Date/Time   LIPASE 17 07/05/2014 1441     Studies/Results: Ct Aspiration  07/06/2014   INDICATION: History of ruptured appendicitis, now with multiple peripherally enhancing fluid collections within in the right lower abdominal quadrant. Please perform CT-guided abscess catheter drainage placement  EXAM: 1. CT IMAGE GUIDED DRAINAGE BY PERCUTANEOUS CATHETER 2. CT-GUIDED ASPIRATION  COMPARISON:  CT abdomen pelvis - 07/05/2014  MEDICATIONS: The patient is currently admitted to the hospital and receiving intravenous antibiotics. The antibiotics were administered within  an appropriate time frame prior to the initiation of the procedure.  ANESTHESIA/SEDATION: Fentanyl 100  mcg IV; Versed 2 mg IV  Total Moderate Sedation time  45 minutes  CONTRAST:  None  COMPLICATIONS: None immediate  PROCEDURE: Informed written consent was obtained from the patient after a discussion of the risks, benefits and alternatives to treatment. The patient was placed supine on the CT gantry and a pre procedural CT was performed re-demonstrating the known abscess/fluid collection within the anterior aspect of the right lower abdomen measuring approximately 3.2 x 3.2 cm (image 28, series 2) as well as a additional approximately 4.2 x 2.9 cm fluid collection within the midline of the right lower abdomen (image 23, series 2).  Attention was first paid towards percutaneous drainage of the more superficial fluid collection within the right lower abdominal quadrant. The procedure was planned. A timeout was performed prior to the initiation of the procedure.  The skin overlying the right lower abdomen was prepped and draped in the usual sterile fashion. The overlying soft tissues were anesthetized with 1% lidocaine with epinephrine. Appropriate trajectory was planned with the use of a 22 gauge spinal needle. An 18 gauge trocar needle was advanced into the abscess/fluid collection. Appropriate positioning was confirmed (representative image 1, series 6) and approximately 5 cc of slightly blood tinged serous fluid was aspirated.  A short Amplatz super stiff wire was coiled within the  collection (series 7). Appropriate positioning was confirmed with a limited CT scan. The tract was serially dilated allowing placement of a 10 French all-purpose drainage catheter, however the percutaneous drainage catheter would not coil within the fluid collection. The percutaneous drainage catheter was ultimately retracted and removed.  --------------------------------------------------------  Attention was now paid towards the more  well organized approximately 4.2 cm fluid collection within the midline of the right lower abdomen. The overlying soft tissues were anesthetized with 1% lidocaine with epinephrine. Appropriate trajectory was planned with the use of a 22 gauge spinal needle. An 18 gauge trocar needle was advanced into the abscess/fluid collection however given concern for the initial trocar needle's proximity to an adjacent loop of small bowel, an additional 18 gauge trocar needle was utilized to access the complex fluid collection from a more caudal location utilizing a more caudal-cranial trajectory. Appropriate positioning was confirmed with a limited postprocedural CT scan.  A short Amplatz superstiff wire was coiled within the collection (series 21). Appropriate position was confirmed with a limited CT scan. The tract was serially dilated allowing placement of a 10 [place] all-purpose drainage catheter. Approximately 25 cc of purulent material was aspirated following percutaneous drainage catheter placement. The tube was connected to a drainage bag and sutured in place. A dressing was placed. The patient tolerated the procedure well without immediate post procedural complication.  IMPRESSION: 1. Successful CT guided placement of a 10 [place] all purpose drain catheter into the midline of the abdomen with aspiration of 25 mL of purulent fluid. Samples were sent to the laboratory as requested by the ordering clinical team. 2. Successful CT-guided aspiration of approximately 5 cc of slightly blood tinged serous fluid from the more superficially located fluid collection within the right lower abdominal quadrant. Note, a percutaneous drainage catheter would not form within this more superficial fluid collection.   Electronically Signed   By: Sandi Mariscal M.D.   On: 07/06/2014 15:48   Ct Image Guided Drainage By Percutaneous Catheter  07/06/2014   INDICATION: History of ruptured appendicitis, now with multiple peripherally enhancing  fluid collections within in the right lower abdominal quadrant. Please perform CT-guided abscess catheter drainage placement  EXAM: 1. CT IMAGE GUIDED DRAINAGE BY PERCUTANEOUS CATHETER 2. CT-GUIDED ASPIRATION  COMPARISON:  CT abdomen pelvis - 07/05/2014  MEDICATIONS: The patient is currently admitted to the hospital and receiving intravenous antibiotics. The antibiotics were administered within an appropriate time frame prior to the initiation of the procedure.  ANESTHESIA/SEDATION: Fentanyl 100  mcg IV; Versed 2 mg IV  Total Moderate Sedation time  45 minutes  CONTRAST:  None  COMPLICATIONS: None immediate  PROCEDURE: Informed written consent was obtained from the patient after a discussion of the risks, benefits and alternatives to treatment. The patient was placed supine on the CT gantry and a pre procedural CT was performed re-demonstrating the known abscess/fluid collection within the anterior aspect of the right lower abdomen measuring approximately 3.2 x 3.2 cm (image 28, series 2) as well as a additional approximately 4.2 x 2.9 cm fluid collection within the midline of the right lower abdomen (image 23, series 2).  Attention was first paid towards percutaneous drainage of the more superficial fluid collection within the right lower abdominal quadrant. The procedure was planned. A timeout was performed prior to the initiation of the procedure.  The skin overlying the right lower abdomen was prepped and draped in the usual sterile fashion. The overlying soft tissues were anesthetized with 1% lidocaine with epinephrine. Appropriate  trajectory was planned with the use of a 22 gauge spinal needle. An 18 gauge trocar needle was advanced into the abscess/fluid collection. Appropriate positioning was confirmed (representative image 1, series 6) and approximately 5 cc of slightly blood tinged serous fluid was aspirated.  A short Amplatz super stiff wire was coiled within the collection (series 7). Appropriate  positioning was confirmed with a limited CT scan. The tract was serially dilated allowing placement of a 10 French all-purpose drainage catheter, however the percutaneous drainage catheter would not coil within the fluid collection. The percutaneous drainage catheter was ultimately retracted and removed.  --------------------------------------------------------  Attention was now paid towards the more well organized approximately 4.2 cm fluid collection within the midline of the right lower abdomen. The overlying soft tissues were anesthetized with 1% lidocaine with epinephrine. Appropriate trajectory was planned with the use of a 22 gauge spinal needle. An 18 gauge trocar needle was advanced into the abscess/fluid collection however given concern for the initial trocar needle's proximity to an adjacent loop of small bowel, an additional 18 gauge trocar needle was utilized to access the complex fluid collection from a more caudal location utilizing a more caudal-cranial trajectory. Appropriate positioning was confirmed with a limited postprocedural CT scan.  A short Amplatz superstiff wire was coiled within the collection (series 21). Appropriate position was confirmed with a limited CT scan. The tract was serially dilated allowing placement of a 10 [place] all-purpose drainage catheter. Approximately 25 cc of purulent material was aspirated following percutaneous drainage catheter placement. The tube was connected to a drainage bag and sutured in place. A dressing was placed. The patient tolerated the procedure well without immediate post procedural complication.  IMPRESSION: 1. Successful CT guided placement of a 10 [place] all purpose drain catheter into the midline of the abdomen with aspiration of 25 mL of purulent fluid. Samples were sent to the laboratory as requested by the ordering clinical team. 2. Successful CT-guided aspiration of approximately 5 cc of slightly blood tinged serous fluid from the more  superficially located fluid collection within the right lower abdominal quadrant. Note, a percutaneous drainage catheter would not form within this more superficial fluid collection.   Electronically Signed   By: Sandi Mariscal M.D.   On: 07/06/2014 15:48    Medications: . heparin  5,000 Units Subcutaneous 3 times per day  . ipratropium-albuterol  3 mL Nebulization BID  . pantoprazole (PROTONIX) IV  40 mg Intravenous QHS  . piperacillin-tazobactam (ZOSYN)  IV  3.375 g Intravenous 3 times per day  . zolpidem  5 mg Oral QHS    Assessment/Plan 1.  Ruptured appendicitis with 2 adjacent abscesses. Diverticulitis less likely, but not ruled out.  Delayed presentation after 1 week of symptoms. IR drain placement/aspiration 07/06/14, Dr. Pascal Lux 2.  Hx of tobacco use    Plan:  She is doing well, I will advance her to full liquids.  Continue to mobilize.    LOS: 3 days    Jiya Kissinger 07/08/2014

## 2014-07-09 DIAGNOSIS — K3533 Acute appendicitis with perforation and localized peritonitis, with abscess: Secondary | ICD-10-CM | POA: Diagnosis present

## 2014-07-09 DIAGNOSIS — J45909 Unspecified asthma, uncomplicated: Secondary | ICD-10-CM | POA: Diagnosis present

## 2014-07-09 DIAGNOSIS — G4700 Insomnia, unspecified: Secondary | ICD-10-CM | POA: Diagnosis present

## 2014-07-09 DIAGNOSIS — F17210 Nicotine dependence, cigarettes, uncomplicated: Secondary | ICD-10-CM | POA: Diagnosis present

## 2014-07-09 MED ORDER — LACTATED RINGERS IV BOLUS (SEPSIS): 1000.0000 mL | Freq: Three times a day (TID) | INTRAVENOUS | Status: DC | PRN

## 2014-07-09 MED ORDER — SODIUM CHLORIDE 0.9 % IV SOLN: 250.0000 mL | INTRAVENOUS | Status: DC | PRN

## 2014-07-09 MED ORDER — ONDANSETRON HCL 4 MG PO TABS: 4.0000 mg | ORAL_TABLET | Freq: Four times a day (QID) | ORAL | Status: DC | PRN

## 2014-07-09 MED ORDER — SODIUM CHLORIDE 0.9 % IJ SOLN
3.0000 mL | Freq: Two times a day (BID) | INTRAMUSCULAR | Status: DC
  Administered 2014-07-09: 3 mL via INTRAVENOUS

## 2014-07-09 MED ORDER — SODIUM CHLORIDE 0.9 % IJ SOLN: 3.0000 mL | INTRAMUSCULAR | Status: DC | PRN

## 2014-07-09 MED ORDER — SACCHAROMYCES BOULARDII 250 MG PO CAPS
250.0000 mg | ORAL_CAPSULE | Freq: Two times a day (BID) | ORAL | Status: DC
  Administered 2014-07-09 – 2014-07-10 (×3): 250 mg via ORAL
  Filled 2014-07-09 (×4): qty 1

## 2014-07-09 NOTE — Progress Notes (Signed)
[facility]  [address] [place], [place] [address] Phone: [phone] FAX: [phone]    Alaine Loughney [id number] 1970/12/27  CARE TEAM:  PCP: No primary care provider on file.  Outpatient Care Team: Patient Care Team: Aletha Halim, PA-C as Physician Assistant (Family Medicine)  Inpatient Treatment Team: Treatment Team: Attending Provider: Nolon Nations, MD; Technician: Dayton Scrape, NT; Consulting Physician: Nolon Nations, MD; Technician: Val Riles, NT; Registered Nurse: Delena Bali, RN   Subjective:  Feeling better Family in room Tol fulls  Objective:  Vital signs:  Filed Vitals:   07/08/14 0828 07/08/14 1523 07/08/14 2118 07/09/14 0614  BP:  103/60 113/61 112/64  Pulse:  68 62 61  Temp:  98 F (36.7 C) 98.3 F (36.8 C) 97.8 F (36.6 C)  TempSrc:  Oral Oral Oral  Resp:  16 18 18   Height:      Weight:      SpO2: 94% 92% 96% 98%    Last BM Date: 07/04/14  Intake/Output   Yesterday:  12/24 0701 - 12/25 0700 In: 9941.7 [P.O.:800; I.V.:9121.7] Out: 50 [Drains:50] This shift:  Total I/O In: 240 [P.O.:240] Out: -   Bowel function:  Flatus: y  BM: n  Drain: thick tan & more minimal  Physical Exam:  General: Pt awake/alert/oriented x4 in no acute distress Eyes: PERRL, normal EOM.  Sclera clear.  No icterus Neuro: CN II-XII intact w/o focal sensory/motor deficits. Lymph: No head/neck/groin lymphadenopathy Psych:  No delerium/psychosis/paranoia HENT: Normocephalic, Mucus membranes moist.  No thrush Neck: Supple, No tracheal deviation Chest: No chest wall pain w good excursion CV:  Pulses intact.  Regular rhythm MS: Normal AROM mjr joints.  No obvious deformity Abdomen: Soft.  Nondistended.  Mildly tender at RLQ drain site only.  No evidence of peritonitis.  No incarcerated hernias. Ext:  SCDs BLE.  No mjr edema.  No cyanosis Skin: No petechiae / purpura   Problem List:   Active Problems:  Perforated appendix   Appendicitis with perforation   Intra-abdominal abscess   Assessment  Kelli Lamb  43 y.o. female       Improving  Plan:  Adv to solids Wean off IVF  Continue drain care - flush more regularly.  Hold off on repeat CT scan for now since improving.  Most likely plan f/u study as outpt - drain clinic in IR department  -VTE prophylaxis- SCDs, etc -mobilize as tolerated to help recovery  D/C patient from hospital when patient meets criteria (anticipate in 1-2 day(s)):  Tolerating oral intake well Ambulating in walkways Adequate pain control without IV medications Urinating  Having flatus Comfortable with drain care  I updated the status of the patient to the patient & her family.  I made recommendations.  I answered questions.  Understanding & appreciation was expressed.   Adin Hector, M.D., F.A.C.S. Gastrointestinal and Minimally Invasive Surgery [facility] [address] [place], [place] [address] [phone] Main / Paging   07/09/2014   Results:   Labs: Results for orders placed or performed during the hospital encounter of 07/05/14 (from the past 48 hour(s))  CBC     Status: Abnormal   Collection Time: 07/08/14 12:10 AM  Result Value Ref Range   WBC 9.5 4.0 - 10.5 K/uL   RBC 3.41 (L) 3.87 - 5.11 MIL/uL   Hemoglobin 9.8 (L) 12.0 - 15.0 g/dL   HCT 30.5 (L) 36.0 - 46.0 %  MCV 89.4 78.0 - 100.0 fL   MCH 28.7 26.0 - 34.0 pg   MCHC 32.1 30.0 - 36.0 g/dL   RDW 13.9 11.5 - 15.5 %   Platelets 295 150 - 400 K/uL    Imaging / Studies: No results found.  Medications / Allergies: per chart  Antibiotics: Anti-infectives    Start     Dose/Rate Route Frequency Ordered Stop   07/06/14 0400  piperacillin-tazobactam (ZOSYN) IVPB 3.375 g     3.375 g12.5 mL/hr over 240 Minutes Intravenous 3 times per day 07/05/14 1839     07/05/14 2200  piperacillin-tazobactam (ZOSYN) IVPB 3.375 g  Status:  Discontinued      3.375 g12.5 mL/hr over 240 Minutes Intravenous 3 times per day 07/05/14 1822 07/05/14 1839   07/05/14 1845  piperacillin-tazobactam (ZOSYN) IVPB 3.375 g  Status:  Discontinued     3.375 g12.5 mL/hr over 240 Minutes Intravenous NOW 07/05/14 1836 07/05/14 1836   07/05/14 1845  piperacillin-tazobactam (ZOSYN) IVPB 3.375 g     3.375 g100 mL/hr over 30 Minutes Intravenous NOW 07/05/14 1837 07/05/14 1925       Note: Portions of this report may have been transcribed using voice recognition software. Every effort was made to ensure accuracy; however, inadvertent computerized transcription errors may be present.   Any transcriptional errors that result from this process are unintentional.

## 2014-07-10 LAB — CBC
HCT: 30.3 % — ABNORMAL LOW (ref 36.0–46.0)
Hemoglobin: 9.5 g/dL — ABNORMAL LOW (ref 12.0–15.0)
MCH: 28.8 pg (ref 26.0–34.0)
MCHC: 31.4 g/dL (ref 30.0–36.0)
MCV: 91.8 fL (ref 78.0–100.0)
Platelets: 382 10*3/uL (ref 150–400)
RBC: 3.3 MIL/uL — AB (ref 3.87–5.11)
RDW: 14.2 % (ref 11.5–15.5)
WBC: 7.8 10*3/uL (ref 4.0–10.5)

## 2014-07-10 LAB — CULTURE, ROUTINE-ABSCESS
CULTURE: NO GROWTH
Gram Stain: NONE SEEN
Special Requests: NORMAL

## 2014-07-10 MED ORDER — HYDROCODONE-ACETAMINOPHEN 5-325 MG PO TABS: 1.0000 | ORAL_TABLET | Freq: Four times a day (QID) | ORAL | Status: DC | PRN

## 2014-07-10 MED ORDER — AMOXICILLIN-POT CLAVULANATE 875-125 MG PO TABS: 1.0000 | ORAL_TABLET | Freq: Two times a day (BID) | ORAL | Status: DC

## 2014-07-10 NOTE — Progress Notes (Signed)
General Surgery Note  LOS: 5 days  POD -     Assessment/Plan: 1.  Perc drain of RLQ abscess  Probable perforated appendicitis  IR perc drain 07/06/2014 by Dr. Ulyess Blossom - 12/22>>>  WBC - 7,800 - 07/10/2014  Looks good, ready to go home.  2.  Smokes  She knows that this is bad for her health and will limit wound healing 3.  Anemia - Hgb - 9.5 - 07/10/2014 4.  DVT prophylaxis - SQ Heparin 5.  Father had colon cancer in late 18's - I discussed getting a colonoscopy with the patient when this acute illness clears.   Principal Problem:   Acute appendicitis with perforation and peritoneal abscess Active Problems:   Tobacco abuse   Asthma, chronic   Insomnia  Subjective:  Looks good.  Husband in room.  I reviewed findings with patient.  There is some confusion because she has been seen by a different person every day.  But appears ready to go home. Objective:   Filed Vitals:   07/10/14 0559  BP: 91/55  Pulse: 56  Temp: 98.3 F (36.8 C)  Resp: 18     Intake/Output from previous day:  12/25 0701 - 12/26 0700 In: 270 [P.O.:240] Out: 20 [Drains:20]  Intake/Output this shift:      Physical Exam:   General: WN WF who is alert and oriented.    HEENT: Normal. Pupils equal. .   Lungs: Clear.   Abdomen: Drain - 20 cc recorded last 24 hours.  Sof, has BS.      Lab Results:    Recent Labs  07/08/14 0010 07/10/14 0555  WBC 9.5 7.8  HGB 9.8* 9.5*  HCT 30.5* 30.3*  PLT 295 382    BMET  No results for input(s): NA, K, CL, CO2, GLUCOSE, BUN, CREATININE, CALCIUM in the last 72 hours.  PT/INR  No results for input(s): LABPROT, INR in the last 72 hours.  ABG  No results for input(s): PHART, HCO3 in the last 72 hours.  Invalid input(s): PCO2, PO2   Studies/Results:  No results found.   Anti-infectives:   Anti-infectives    Start     Dose/Rate Route Frequency Ordered Stop   07/06/14 0400  piperacillin-tazobactam (ZOSYN) IVPB 3.375 g     3.375 g12.5 mL/hr over  240 Minutes Intravenous 3 times per day 07/05/14 1839     07/05/14 2200  piperacillin-tazobactam (ZOSYN) IVPB 3.375 g  Status:  Discontinued     3.375 g12.5 mL/hr over 240 Minutes Intravenous 3 times per day 07/05/14 1822 07/05/14 1839   07/05/14 1845  piperacillin-tazobactam (ZOSYN) IVPB 3.375 g  Status:  Discontinued     3.375 g12.5 mL/hr over 240 Minutes Intravenous NOW 07/05/14 1836 07/05/14 1836   07/05/14 1845  piperacillin-tazobactam (ZOSYN) IVPB 3.375 g     3.375 g100 mL/hr over 30 Minutes Intravenous NOW 07/05/14 1837 07/05/14 1925      Alphonsa Overall, MD, FACS Pager: [facility] Office: [phone] 07/10/2014

## 2014-07-10 NOTE — Discharge Summary (Signed)
Physician Discharge Summary  Patient ID:  Kelli Lamb  MRN: [id number]  DOB/AGE: [birth year] 44 y.o.  Admit date: 07/05/2014 Discharge date: 07/10/2014  Discharge Diagnoses:  1.  Probably perforated appendicitis with RLQ abscess  Has perc drain placed 07/06/2014. 2. Smokes 3. Anemia - Hgb - 9.5 - 07/10/2014 4. Family history of colon cancer 5.  Left adrenal adenoma   Principal Problem:   Acute appendicitis with perforation and peritoneal abscess Active Problems:   Tobacco abuse   Asthma, chronic   Insomnia  Operation:  on * No surgery found *  Discharged Condition: good  Hospital Course: Kelli Lamb is an 43 y.o. female whose primary care physician is No primary care provider on file. and who was admitted 07/05/2014 with a chief complaint of  Chief Complaint  Patient presents with  . Abdominal Pain  .  On CT scan in ER on 07/05/2014, Kelli Lamb was found to a right lower quadrant abscess consistent with an periappendiceal abscess.  She underwent a perc drain of the abscess on 07/06/2014 by Pascal Lux.  And she was placed on antibiotics. She has steadily improved.   She is now afebrile, her WBC is normal.  She is sore, but tolerating reg diet. She is a little anxious about going home, but I think that she is ready.  Her husband is in the room with her.  We will arrange for her to be seen in the drain clinic this Wednesday, 12/30. Also, her father had colon cancer in his late 21's, so I talked to her about getting a colonoscopy when she gets over this.  The discharge instructions were reviewed with the patient.  Consults: None  Significant Diagnostic Studies: Results for orders placed or performed during the hospital encounter of 07/05/14  Culture, routine-abscess  Result Value Ref Range   Specimen Description ABSCESS DRAINAGE Periappendiceal    Special Requests Normal    Gram Stain      NO WBC SEEN NO SQUAMOUS EPITHELIAL CELLS SEEN NO ORGANISMS SEEN Performed at  [facility]    Culture      NO GROWTH 3 DAYS Performed at [facility]    Report Status 07/10/2014 FINAL   Culture, routine-abscess  Result Value Ref Range   Specimen Description ABSCESS DRAINAGE PERI APPENDICEAL    Special Requests Normal    Gram Stain      ABUNDANT WBC PRESENT, PREDOMINANTLY PMN NO SQUAMOUS EPITHELIAL CELLS SEEN NO ORGANISMS SEEN Performed at [facility]    Culture      NO GROWTH 3 DAYS Performed at [facility]    Report Status PENDING   CBC with Differential  Result Value Ref Range   WBC 11.5 (H) 4.0 - 10.5 K/uL   RBC 4.26 3.87 - 5.11 MIL/uL   Hemoglobin 12.3 12.0 - 15.0 g/dL   HCT 38.7 36.0 - 46.0 %   MCV 90.8 78.0 - 100.0 fL   MCH 28.9 26.0 - 34.0 pg   MCHC 31.8 30.0 - 36.0 g/dL   RDW 14.6 11.5 - 15.5 %   Platelets 320 150 - 400 K/uL   Neutrophils Relative % 76 43 - 77 %   Neutro Abs 8.7 (H) 1.7 - 7.7 K/uL   Lymphocytes Relative 14 12 - 46 %   Lymphs Abs 1.6 0.7 - 4.0 K/uL   Monocytes Relative 8 3 - 12 %   Monocytes Absolute 1.0 0.1 - 1.0 K/uL   Eosinophils Relative 2 0 - 5 %  Eosinophils Absolute 0.2 0.0 - 0.7 K/uL   Basophils Relative 0 0 - 1 %   Basophils Absolute 0.0 0.0 - 0.1 K/uL  Comprehensive metabolic panel  Result Value Ref Range   Sodium 136 (L) 137 - 147 mEq/L   Potassium 4.0 3.7 - 5.3 mEq/L   Chloride 96 96 - 112 mEq/L   CO2 26 19 - 32 mEq/L   Glucose, Bld 97 70 - 99 mg/dL   BUN 10 6 - 23 mg/dL   Creatinine, Ser 0.53 0.50 - 1.10 mg/dL   Calcium 9.2 8.4 - 10.5 mg/dL   Total Protein 7.2 6.0 - 8.3 g/dL   Albumin 2.9 (L) 3.5 - 5.2 g/dL   AST 15 0 - 37 U/L   ALT 56 (H) 0 - 35 U/L   Alkaline Phosphatase 153 (H) 39 - 117 U/L   Total Bilirubin 0.3 0.3 - 1.2 mg/dL   GFR calc non Af Amer >90 >90 mL/min   GFR calc Af Amer >90 >90 mL/min   Anion gap 14 5 - 15  Lipase, blood  Result Value Ref Range   Lipase 17 11 - 59 U/L  Urinalysis, Routine w reflex microscopic  Result Value Ref Range    Color, Urine YELLOW YELLOW   APPearance CLEAR CLEAR   Specific Gravity, Urine 1.017 1.005 - 1.030   pH 5.0 5.0 - 8.0   Glucose, UA NEGATIVE NEGATIVE mg/dL   Hgb urine dipstick NEGATIVE NEGATIVE   Bilirubin Urine NEGATIVE NEGATIVE   Ketones, ur NEGATIVE NEGATIVE mg/dL   Protein, ur NEGATIVE NEGATIVE mg/dL   Urobilinogen, UA 0.2 0.0 - 1.0 mg/dL   Nitrite NEGATIVE NEGATIVE   Leukocytes, UA MODERATE (A) NEGATIVE  Urine microscopic-add on  Result Value Ref Range   Squamous Epithelial / LPF FEW (A) RARE   WBC, UA 11-20 <3 WBC/hpf   RBC / HPF 0-2 <3 RBC/hpf   Bacteria, UA RARE RARE   Urine-Other MUCOUS PRESENT   Basic metabolic panel  Result Value Ref Range   Sodium 136 135 - 145 mmol/L   Potassium 4.0 3.5 - 5.1 mmol/L   Chloride 103 96 - 112 mEq/L   CO2 24 19 - 32 mmol/L   Glucose, Bld 102 (H) 70 - 99 mg/dL   BUN 7 6 - 23 mg/dL   Creatinine, Ser 0.49 (L) 0.50 - 1.10 mg/dL   Calcium 8.2 (L) 8.4 - 10.5 mg/dL   GFR calc non Af Amer >90 >90 mL/min   GFR calc Af Amer >90 >90 mL/min   Anion gap 9 5 - 15  CBC  Result Value Ref Range   WBC 12.4 (H) 4.0 - 10.5 K/uL   RBC 3.72 (L) 3.87 - 5.11 MIL/uL   Hemoglobin 10.7 (L) 12.0 - 15.0 g/dL   HCT 33.9 (L) 36.0 - 46.0 %   MCV 91.1 78.0 - 100.0 fL   MCH 28.8 26.0 - 34.0 pg   MCHC 31.6 30.0 - 36.0 g/dL   RDW 14.5 11.5 - 15.5 %   Platelets 293 150 - 400 K/uL  Prealbumin  Result Value Ref Range   Prealbumin 9.9 (L) 17.0 - 34.0 mg/dL  APTT  Result Value Ref Range   aPTT 31 24 - 37 seconds  Protime-INR  Result Value Ref Range   Prothrombin Time 14.9 11.6 - 15.2 seconds   INR 1.16 0.00 - 1.49  CBC  Result Value Ref Range   WBC 9.5 4.0 - 10.5 K/uL   RBC 3.41 (L) 3.87 -  5.11 MIL/uL   Hemoglobin 9.8 (L) 12.0 - 15.0 g/dL   HCT 30.5 (L) 36.0 - 46.0 %   MCV 89.4 78.0 - 100.0 fL   MCH 28.7 26.0 - 34.0 pg   MCHC 32.1 30.0 - 36.0 g/dL   RDW 13.9 11.5 - 15.5 %   Platelets 295 150 - 400 K/uL  CBC  Result Value Ref Range   WBC 7.8 4.0 -  10.5 K/uL   RBC 3.30 (L) 3.87 - 5.11 MIL/uL   Hemoglobin 9.5 (L) 12.0 - 15.0 g/dL   HCT 30.3 (L) 36.0 - 46.0 %   MCV 91.8 78.0 - 100.0 fL   MCH 28.8 26.0 - 34.0 pg   MCHC 31.4 30.0 - 36.0 g/dL   RDW 14.2 11.5 - 15.5 %   Platelets 382 150 - 400 K/uL  POC Urine Pregnancy, ED  (If Pre-menopausal female) - do not order at [facility]  Result Value Ref Range   Preg Test, Ur NEGATIVE NEGATIVE    Ct Abdomen Pelvis W Contrast  07/05/2014   CLINICAL DATA:  One week history of fever abdominal pain. Elevated white blood cell count  EXAM: CT ABDOMEN AND PELVIS WITH CONTRAST  TECHNIQUE:  IMPRESSION: Findings consistent with periappendiceal abscess, likely due to appendiceal rupture. This periappendiceal abscess measures 5.1 x 3.6 x 2.5 cm. A second developing abscess is noted slightly superior to the urinary bladder on the right measuring 5.5 x 3.5 x 2.9 cm. There is extensive inflammation throughout the pelvis with wall thickening in the urinary bladder likely due to the nearby abscess as well as multiple loops of bowel with thickened walls and surrounding mesenteric inflammation. Small amount of fluid in the cul-de-sac is likely due to the inflammation from the periappendiceal abscesses as well.  Uterus is prominent, likely due at least in part to edema from the surrounding inflammation. Leiomyomatous change in the uterus cannot be excluded, however.  Liver enlarged without focal lesion.  There is a left adrenal adenoma, a benign finding.  Critical Value/emergent results were called by telephone at the time of interpretation on 07/05/2014 at 5:08 pm to [facility] , who verbally acknowledged these results.   Electronically Signed   By: Lowella Grip M.D.   On: 07/05/2014 17:10   Ct Aspiration  07/06/2014   INDICATION: History of ruptured appendicitis, now with multiple peripherally enhancing fluid collections within in the right lower abdominal quadrant. Please perform CT-guided abscess catheter  drainage placement  EXAM: 1. CT IMAGE GUIDED DRAINAGE BY PERCUTANEOUS CATHETER  IMPRESSION:  1. Successful CT guided placement of a 10 [place] all purpose drain catheter into the midline of the abdomen with aspiration of 25 mL of purulent fluid. Samples were sent to the laboratory as requested by the ordering clinical team. 2. Successful CT-guided aspiration of approximately 5 cc of slightly blood tinged serous fluid from the more superficially located fluid collection within the right lower abdominal quadrant. Note, a percutaneous drainage catheter would not form within this more superficial fluid collection.   Electronically Signed   By: Sandi Mariscal M.D.   On: 07/06/2014 15:48   Discharge Exam:  Filed Vitals:   07/10/14 0559  BP: 91/55  Pulse: 56  Temp: 98.3 F (36.8 C)  Resp: 18   General: WN WF who is alert and generally healthy appearing.  Lungs: Clear to auscultation and symmetric breath sounds. Heart:  RRR. No murmur or rub. Abdomen: Soft. No mass.  Normal bowel sounds. Drain coming out of  RLQ.  Some tenderness around the drain.  Discharge Medications:     Medication List    TAKE these medications        albuterol 108 (90 BASE) MCG/ACT inhaler  Commonly known as:  PROVENTIL HFA;VENTOLIN HFA  Inhale 1 puff into the lungs every 4 (four) hours as needed for wheezing or shortness of breath.     amoxicillin-clavulanate 875-125 MG per tablet  Commonly known as:  AUGMENTIN  Take 1 tablet by mouth 2 (two) times daily.     ciprofloxacin 500 MG tablet  Commonly known as:  CIPRO  Take 500 mg by mouth 2 (two) times daily. She is taking for 7 days. She started on 06/30/14 and has not completed.     HYDROcodone-acetaminophen 5-325 MG per tablet  Commonly known as:  NORCO/VICODIN  Take 1-2 tablets by mouth every 6 (six) hours as needed.     HYDROMET 5-1.5 MG/5ML syrup  Generic drug:  HYDROcodone-homatropine  Take 5 mLs by mouth every 6 (six) hours as needed for cough.      montelukast 10 MG tablet  Commonly known as:  SINGULAIR  Take 10 mg by mouth daily as needed (For allergies.).     zolpidem 10 MG tablet  Commonly known as:  AMBIEN  Take 10 mg by mouth at bedtime.      ASK your doctor about these medications        lactulose 10 GM/15ML solution  Commonly known as:  CHRONULAC        Disposition: ED Dismiss - Never Arrived      Discharge Instructions    Diet - low sodium heart healthy    Complete by:  As directed      Increase activity slowly    Complete by:  As directed            Return to work on:  Possibly 07/19/2014.  She works with the [employer].  Activity:  Driving - Don't drive until off pain meds   Lifting - No lifting > 15 pounds while drain in place  Wound Care:   Irrigate drain with 10 cc of fluid twice a dy  Diet:  As tolerated  Follow up appointment:  Call Dr. Darrel Hoover office [facility]) at [phone] for an appointment in 7-10 days  Medications and dosages:  Resume your home medications.  You have a prescription for:  Augmentin and Vicodin  Signed: Alphonsa Overall, M.D., [facility] Surgery Office:  [phone]  07/10/2014, 10:22 AM

## 2014-07-10 NOTE — Progress Notes (Signed)
Referring Physician(s): CCS  Subjective: Patient c/o tenderness at drain site, denies any worsening abdominal/pelvic pain other than drain. She is tolerating diet without N/V  Allergies: Review of patient's allergies indicates no known allergies.  Medications: Prior to Admission medications   Medication Sig Start Date End Date Taking? Authorizing Provider  albuterol (PROVENTIL HFA;VENTOLIN HFA) 108 (90 BASE) MCG/ACT inhaler Inhale 1 puff into the lungs every 4 (four) hours as needed for wheezing or shortness of breath.   Yes Historical Provider, MD  ciprofloxacin (CIPRO) 500 MG tablet Take 500 mg by mouth 2 (two) times daily. She is taking for 7 days. She started on 06/30/14 and has not completed. 06/30/14  Yes Historical Provider, MD  HYDROMET 5-1.5 MG/5ML syrup Take 5 mLs by mouth every 6 (six) hours as needed for cough.  06/16/14  Yes Historical Provider, MD  lactulose (CHRONULAC) 10 GM/15ML solution  06/30/14  Yes Historical Provider, MD  montelukast (SINGULAIR) 10 MG tablet Take 10 mg by mouth daily as needed (For allergies.).  04/15/14  Yes Historical Provider, MD  zolpidem (AMBIEN) 10 MG tablet Take 10 mg by mouth at bedtime.  06/23/14  Yes Historical Provider, MD    Review of Systems  Vital Signs: BP 91/55 mmHg  Pulse 56  Temp(Src) 98.3 F (36.8 C) (Oral)  Resp 18  Ht 5\' 4"  (1.626 m)  Wt 163 lb 9.3 oz (74.2 kg)  BMI 28.06 kg/m2  SpO2 98%  LMP 06/11/2014 (Exact Date)  Physical Exam General: A&Ox3, NAD Abd: Soft, tenderness at midline pelvic drain site, thin light brown output in bag < 5cc, 24 hr output 20cc  Imaging: Ct Aspiration  07/06/2014   INDICATION: History of ruptured appendicitis, now with multiple peripherally enhancing fluid collections within in the right lower abdominal quadrant. Please perform CT-guided abscess catheter drainage placement  EXAM: 1. CT IMAGE GUIDED DRAINAGE BY PERCUTANEOUS CATHETER 2. CT-GUIDED ASPIRATION  COMPARISON:  CT abdomen pelvis -  07/05/2014  MEDICATIONS: The patient is currently admitted to the hospital and receiving intravenous antibiotics. The antibiotics were administered within an appropriate time frame prior to the initiation of the procedure.  ANESTHESIA/SEDATION: Fentanyl 100  mcg IV; Versed 2 mg IV  Total Moderate Sedation time  45 minutes  CONTRAST:  None  COMPLICATIONS: None immediate  PROCEDURE: Informed written consent was obtained from the patient after a discussion of the risks, benefits and alternatives to treatment. The patient was placed supine on the CT gantry and a pre procedural CT was performed re-demonstrating the known abscess/fluid collection within the anterior aspect of the right lower abdomen measuring approximately 3.2 x 3.2 cm (image 28, series 2) as well as a additional approximately 4.2 x 2.9 cm fluid collection within the midline of the right lower abdomen (image 23, series 2).  Attention was first paid towards percutaneous drainage of the more superficial fluid collection within the right lower abdominal quadrant. The procedure was planned. A timeout was performed prior to the initiation of the procedure.  The skin overlying the right lower abdomen was prepped and draped in the usual sterile fashion. The overlying soft tissues were anesthetized with 1% lidocaine with epinephrine. Appropriate trajectory was planned with the use of a 22 gauge spinal needle. An 18 gauge trocar needle was advanced into the abscess/fluid collection. Appropriate positioning was confirmed (representative image 1, series 6) and approximately 5 cc of slightly blood tinged serous fluid was aspirated.  A short Amplatz super stiff wire was coiled within the collection (series 7).  Appropriate positioning was confirmed with a limited CT scan. The tract was serially dilated allowing placement of a 10 French all-purpose drainage catheter, however the percutaneous drainage catheter would not coil within the fluid collection. The percutaneous  drainage catheter was ultimately retracted and removed.  --------------------------------------------------------  Attention was now paid towards the more well organized approximately 4.2 cm fluid collection within the midline of the right lower abdomen. The overlying soft tissues were anesthetized with 1% lidocaine with epinephrine. Appropriate trajectory was planned with the use of a 22 gauge spinal needle. An 18 gauge trocar needle was advanced into the abscess/fluid collection however given concern for the initial trocar needle's proximity to an adjacent loop of small bowel, an additional 18 gauge trocar needle was utilized to access the complex fluid collection from a more caudal location utilizing a more caudal-cranial trajectory. Appropriate positioning was confirmed with a limited postprocedural CT scan.  A short Amplatz superstiff wire was coiled within the collection (series 21). Appropriate position was confirmed with a limited CT scan. The tract was serially dilated allowing placement of a 10 [place] all-purpose drainage catheter. Approximately 25 cc of purulent material was aspirated following percutaneous drainage catheter placement. The tube was connected to a drainage bag and sutured in place. A dressing was placed. The patient tolerated the procedure well without immediate post procedural complication.  IMPRESSION: 1. Successful CT guided placement of a 10 [place] all purpose drain catheter into the midline of the abdomen with aspiration of 25 mL of purulent fluid. Samples were sent to the laboratory as requested by the ordering clinical team. 2. Successful CT-guided aspiration of approximately 5 cc of slightly blood tinged serous fluid from the more superficially located fluid collection within the right lower abdominal quadrant. Note, a percutaneous drainage catheter would not form within this more superficial fluid collection.   Electronically Signed   By: Sandi Mariscal M.D.   On: 07/06/2014 15:48    Ct Image Guided Drainage By Percutaneous Catheter  07/06/2014   INDICATION: History of ruptured appendicitis, now with multiple peripherally enhancing fluid collections within in the right lower abdominal quadrant. Please perform CT-guided abscess catheter drainage placement  EXAM: 1. CT IMAGE GUIDED DRAINAGE BY PERCUTANEOUS CATHETER 2. CT-GUIDED ASPIRATION  COMPARISON:  CT abdomen pelvis - 07/05/2014  MEDICATIONS: The patient is currently admitted to the hospital and receiving intravenous antibiotics. The antibiotics were administered within an appropriate time frame prior to the initiation of the procedure.  ANESTHESIA/SEDATION: Fentanyl 100  mcg IV; Versed 2 mg IV  Total Moderate Sedation time  45 minutes  CONTRAST:  None  COMPLICATIONS: None immediate  PROCEDURE: Informed written consent was obtained from the patient after a discussion of the risks, benefits and alternatives to treatment. The patient was placed supine on the CT gantry and a pre procedural CT was performed re-demonstrating the known abscess/fluid collection within the anterior aspect of the right lower abdomen measuring approximately 3.2 x 3.2 cm (image 28, series 2) as well as a additional approximately 4.2 x 2.9 cm fluid collection within the midline of the right lower abdomen (image 23, series 2).  Attention was first paid towards percutaneous drainage of the more superficial fluid collection within the right lower abdominal quadrant. The procedure was planned. A timeout was performed prior to the initiation of the procedure.  The skin overlying the right lower abdomen was prepped and draped in the usual sterile fashion. The overlying soft tissues were anesthetized with 1% lidocaine with epinephrine. Appropriate trajectory was planned  with the use of a 22 gauge spinal needle. An 18 gauge trocar needle was advanced into the abscess/fluid collection. Appropriate positioning was confirmed (representative image 1, series 6) and approximately  5 cc of slightly blood tinged serous fluid was aspirated.  A short Amplatz super stiff wire was coiled within the collection (series 7). Appropriate positioning was confirmed with a limited CT scan. The tract was serially dilated allowing placement of a 10 French all-purpose drainage catheter, however the percutaneous drainage catheter would not coil within the fluid collection. The percutaneous drainage catheter was ultimately retracted and removed.  --------------------------------------------------------  Attention was now paid towards the more well organized approximately 4.2 cm fluid collection within the midline of the right lower abdomen. The overlying soft tissues were anesthetized with 1% lidocaine with epinephrine. Appropriate trajectory was planned with the use of a 22 gauge spinal needle. An 18 gauge trocar needle was advanced into the abscess/fluid collection however given concern for the initial trocar needle's proximity to an adjacent loop of small bowel, an additional 18 gauge trocar needle was utilized to access the complex fluid collection from a more caudal location utilizing a more caudal-cranial trajectory. Appropriate positioning was confirmed with a limited postprocedural CT scan.  A short Amplatz superstiff wire was coiled within the collection (series 21). Appropriate position was confirmed with a limited CT scan. The tract was serially dilated allowing placement of a 10 [place] all-purpose drainage catheter. Approximately 25 cc of purulent material was aspirated following percutaneous drainage catheter placement. The tube was connected to a drainage bag and sutured in place. A dressing was placed. The patient tolerated the procedure well without immediate post procedural complication.  IMPRESSION: 1. Successful CT guided placement of a 10 [place] all purpose drain catheter into the midline of the abdomen with aspiration of 25 mL of purulent fluid. Samples were sent to the laboratory as  requested by the ordering clinical team. 2. Successful CT-guided aspiration of approximately 5 cc of slightly blood tinged serous fluid from the more superficially located fluid collection within the right lower abdominal quadrant. Note, a percutaneous drainage catheter would not form within this more superficial fluid collection.   Electronically Signed   By: Sandi Mariscal M.D.   On: 07/06/2014 15:48    Labs:  CBC:  Recent Labs  07/05/14 1441 07/06/14 0513 07/08/14 0010 07/10/14 0555  WBC 11.5* 12.4* 9.5 7.8  HGB 12.3 10.7* 9.8* 9.5*  HCT 38.7 33.9* 30.5* 30.3*  PLT 320 293 295 382    COAGS:  Recent Labs  07/06/14 0513  INR 1.16  APTT 31    BMP:  Recent Labs  07/05/14 1441 07/06/14 0513  NA 136* 136  K 4.0 4.0  CL 96 103  CO2 26 24  GLUCOSE 97 102*  BUN 10 7  CALCIUM 9.2 8.2*  CREATININE 0.53 0.49*  GFRNONAA >90 >90  GFRAA >90 >90    LIVER FUNCTION TESTS:  Recent Labs  07/05/14 1441  BILITOT 0.3  AST 15  ALT 56*  ALKPHOS 153*  PROT 7.2  ALBUMIN 2.9*    Assessment and Plan: Likely ruptured appendicitis with abscess, wbc wnl, afebrile S/p drain placed in IR 12/22 Cx no growth and RLQ collection aspirated 5c c serous fluid, output minimal 20cc/24 hrs thin light brown Continue flushes and monitor daily output Repeat CT with contrast if output remains minimal, if discharged will need to follow up at drain clinic at [facility] 07/20/13 for CT with contrast and evaluation, d/w Dr.  Shick Plans per CCS    I spent a total of 15 minutes face to face in clinical consultation/evaluation, greater than 50% of which was counseling/coordinating care  Signed: Hedy Jacob 07/10/2014, 8:43 AM

## 2014-07-10 NOTE — Discharge Instructions (Signed)
[facility] [place] [facility] - DISCHARGE INSTRUCTIONS TO PATIENT  Return to work on:  Possibly 07/19/2014  Activity:  Driving - Don't drive until off pain meds   Lifting - No lifting > 15 pounds while drain in place  Wound Care:   Irrigate drain with 10 cc of fluid twice a dy  Diet:  As tolerated  Follow up appointment:  Call Dr. Darrel Hoover office [facility]) at [phone] for an appointment in 7-10 days  Medications and dosages:  Resume your home medications.  You have a prescription for:  Augmentin and Vicodin  Call Dr. Dalbert Batman or his office  [phone]) if you have:  Temperature greater than 100.4,  Persistent nausea and vomiting,  Severe uncontrolled pain,  Redness, tenderness, or signs of infection (pain, swelling, redness, odor or green/yellow discharge around the site),  Difficulty breathing, headache or visual disturbances,  Any other questions or concerns you may have after discharge.  In an emergency, call 911 or go to an Emergency Department at a nearby hospital.

## 2014-07-10 NOTE — Progress Notes (Signed)
Discharge instructions given to pt and husband. Husband able to change drain dressing, fush drain. And measure drainage correctly. Patient able to state when to to call md.

## 2014-07-11 LAB — CULTURE, ROUTINE-ABSCESS: SPECIAL REQUESTS: NORMAL

## 2014-07-11 NOTE — Care Management Note (Signed)
    Page 1 of 1   07/11/2014     10:19:13 AM CARE MANAGEMENT NOTE 07/11/2014  Patient:  Kelli Lamb, Kelli Lamb   Account Number:  [id number]  Date Initiated:  07/06/2014  Documentation initiated by:  Kelli Lamb Assessment:   43 yo female admitted with perforated appendix from home     Action/Plan:   discharge planning   Anticipated DC Date:  07/09/2014   Anticipated DC Plan:  [name]  CM consult      Choice offered to / List presented to:          [facility] arranged  [place] - 11 Patient Refused      Status of service:  Completed, signed off Medicare Important Message given?   (If response is "NO", the following Medicare IM given date fields will be blank) Date Medicare IM given:   Medicare IM given by:   Date Additional Medicare IM given:   Additional Medicare IM given by:    Discharge Disposition:  HOME/SELF CARE  Per UR Regulation:    If discussed at Long Length of Stay Meetings, dates discussed:    Comments:  07/11/14 10:10  CM called pt at home bc she and her husband had been taught flusing the drain while still in the hospital and had refused [facility] for drain management; CM asked if they are doing well with the drain and if they thought at this time a HHRN would be helpful.  Pt and husband state they are doing well with the drain and do NOT need any HH services.  Pt and husband very appreciative of the care and teaching they received and the folow up call. No other Cm needs wree communciated.  Kelli Lamb BSN, [place] [phone].  07/06/14 Kelli Shell RN BSn CM [phone] No orders, no needs, will continue to follow

## 2014-07-12 ENCOUNTER — Other Ambulatory Visit (INDEPENDENT_AMBULATORY_CARE_PROVIDER_SITE_OTHER): Payer: Self-pay | Admitting: Surgery

## 2014-07-12 ENCOUNTER — Other Ambulatory Visit (INDEPENDENT_AMBULATORY_CARE_PROVIDER_SITE_OTHER): Payer: Self-pay | Admitting: General Surgery

## 2014-07-12 DIAGNOSIS — K3532 Acute appendicitis with perforation and localized peritonitis, without abscess: Secondary | ICD-10-CM

## 2014-07-12 DIAGNOSIS — T814XXA Infection following a procedure, initial encounter: Secondary | ICD-10-CM

## 2014-07-12 DIAGNOSIS — IMO0001 Reserved for inherently not codable concepts without codable children: Secondary | ICD-10-CM

## 2014-07-12 DIAGNOSIS — K651 Peritoneal abscess: Secondary | ICD-10-CM

## 2014-07-13 ENCOUNTER — Ambulatory Visit (HOSPITAL_COMMUNITY)
Admission: RE | Admit: 2014-07-13 | Discharge: 2014-07-13 | Disposition: A | Discharge status: Home / Self Care | Payer: No Typology Code available for payment source | Source: Ambulatory Visit | Attending: General Surgery | Admitting: General Surgery

## 2014-07-13 ENCOUNTER — Other Ambulatory Visit (HOSPITAL_COMMUNITY): Payer: Self-pay | Admitting: Interventional Radiology

## 2014-07-13 ENCOUNTER — Ambulatory Visit (HOSPITAL_COMMUNITY)
Admission: RE | Admit: 2014-07-13 | Discharge: 2014-07-13 | Disposition: A | Discharge status: Home / Self Care | Payer: No Typology Code available for payment source | Source: Ambulatory Visit | Attending: Interventional Radiology | Admitting: Interventional Radiology

## 2014-07-13 DIAGNOSIS — IMO0001 Reserved for inherently not codable concepts without codable children: Secondary | ICD-10-CM

## 2014-07-13 DIAGNOSIS — K353 Acute appendicitis with localized peritonitis: Secondary | ICD-10-CM | POA: Insufficient documentation

## 2014-07-13 DIAGNOSIS — K3533 Acute appendicitis with perforation and localized peritonitis, with abscess: Secondary | ICD-10-CM

## 2014-07-13 DIAGNOSIS — Z48815 Encounter for surgical aftercare following surgery on the digestive system: Secondary | ICD-10-CM | POA: Diagnosis present

## 2014-07-13 DIAGNOSIS — K651 Peritoneal abscess: Secondary | ICD-10-CM

## 2014-07-13 DIAGNOSIS — T814XXA Infection following a procedure, initial encounter: Secondary | ICD-10-CM

## 2014-07-13 MED ORDER — IOHEXOL 300 MG/ML  SOLN
100.0000 mL | Freq: Once | INTRAMUSCULAR | Status: AC | PRN
  Administered 2014-07-13: 100 mL via INTRAVENOUS

## 2014-07-13 MED ORDER — IOHEXOL 300 MG/ML  SOLN
50.0000 mL | Freq: Once | INTRAMUSCULAR | Status: AC | PRN
  Administered 2014-07-13: 5 mL

## 2014-07-19 ENCOUNTER — Other Ambulatory Visit (INDEPENDENT_AMBULATORY_CARE_PROVIDER_SITE_OTHER): Payer: Self-pay

## 2014-07-19 DIAGNOSIS — K3533 Acute appendicitis with perforation and localized peritonitis, with abscess: Secondary | ICD-10-CM

## 2014-07-20 LAB — COMPLETE METABOLIC PANEL WITH GFR
ALK PHOS: 73 U/L (ref 39–117)
ALT: 19 U/L (ref 0–35)
AST: 25 U/L (ref 0–37)
Albumin: 3.9 g/dL (ref 3.5–5.2)
BUN: 10 mg/dL (ref 6–23)
CO2: 26 mEq/L (ref 19–32)
Calcium: 10.1 mg/dL (ref 8.4–10.5)
Chloride: 99 mEq/L (ref 96–112)
Creat: 0.6 mg/dL (ref 0.50–1.10)
GFR, Est African American: >89 mL/min
GLUCOSE: 95 mg/dL (ref 70–99)
Potassium: 4.2 mEq/L (ref 3.5–5.3)
SODIUM: 140 meq/L (ref 135–145)
Total Bilirubin: 0.5 mg/dL (ref 0.2–1.2)
Total Protein: 7.2 g/dL (ref 6.0–8.3)

## 2014-07-20 LAB — CBC WITH DIFFERENTIAL/PLATELET
Basophils Absolute: 0 10*3/uL (ref 0.0–0.1)
Eosinophils Absolute: 0 10*3/uL (ref 0.0–0.7)
HEMATOCRIT: 42.2 % (ref 36.0–46.0)
HEMOGLOBIN: 13.3 g/dL (ref 12.0–15.0)
LYMPHS ABS: 2.7 10*3/uL (ref 0.7–4.0)
Lymphocytes Relative: 30 % (ref 12–46)
MCH: 27.9 pg (ref 26.0–34.0)
MCHC: 31.4 g/dL (ref 30.0–36.0)
MCV: 88.6 fL (ref 78.0–100.0)
MONOS PCT: 5 % (ref 3–12)
Monocytes Absolute: 0.5 10*3/uL (ref 0.1–1.0)
NEUTROS PCT: 65 % (ref 43–77)
Neutro Abs: 5.9 10*3/uL (ref 1.7–7.7)
Platelets: 393 10*3/uL (ref 150–400)
RBC: 4.77 MIL/uL (ref 3.87–5.11)
RDW: 15.1 % (ref 11.5–15.5)
WBC: 9.1 10*3/uL (ref 4.0–10.5)

## 2014-07-21 ENCOUNTER — Telehealth (HOSPITAL_COMMUNITY): Payer: Self-pay | Admitting: Interventional Radiology

## 2014-07-21 ENCOUNTER — Other Ambulatory Visit (HOSPITAL_COMMUNITY): Payer: Self-pay | Admitting: Interventional Radiology

## 2014-07-21 DIAGNOSIS — L0291 Cutaneous abscess, unspecified: Secondary | ICD-10-CM

## 2014-07-21 NOTE — Telephone Encounter (Signed)
Called pt, left VM for her to call to schedule her next IR drain check for 07/27/14 JM

## 2014-07-27 ENCOUNTER — Ambulatory Visit (HOSPITAL_COMMUNITY)
Admission: RE | Admit: 2014-07-27 | Discharge: 2014-07-27 | Disposition: A | Discharge status: Home / Self Care | Payer: 59 | Source: Ambulatory Visit | Attending: Interventional Radiology | Admitting: Interventional Radiology

## 2014-07-27 DIAGNOSIS — K651 Peritoneal abscess: Secondary | ICD-10-CM | POA: Insufficient documentation

## 2014-07-27 DIAGNOSIS — L0291 Cutaneous abscess, unspecified: Secondary | ICD-10-CM

## 2014-07-27 MED ORDER — IOHEXOL 300 MG/ML  SOLN
50.0000 mL | Freq: Once | INTRAMUSCULAR | Status: AC | PRN
  Administered 2014-07-27: 5 mL

## 2014-09-02 ENCOUNTER — Other Ambulatory Visit (INDEPENDENT_AMBULATORY_CARE_PROVIDER_SITE_OTHER): Payer: Self-pay | Admitting: *Deleted

## 2014-09-02 ENCOUNTER — Other Ambulatory Visit (INDEPENDENT_AMBULATORY_CARE_PROVIDER_SITE_OTHER): Payer: Self-pay | Admitting: General Surgery

## 2014-09-02 DIAGNOSIS — R1031 Right lower quadrant pain: Secondary | ICD-10-CM

## 2014-09-02 DIAGNOSIS — R109 Unspecified abdominal pain: Secondary | ICD-10-CM

## 2014-09-02 NOTE — Addendum Note (Signed)
Addended by: Adin Hector on: 09/02/2014 01:20 PM   Modules accepted: Orders

## 2014-09-09 ENCOUNTER — Ambulatory Visit
Admission: RE | Admit: 2014-09-09 | Discharge: 2014-09-09 | Disposition: A | Discharge status: Home / Self Care | Payer: 59 | Source: Ambulatory Visit | Attending: General Surgery | Admitting: General Surgery

## 2014-09-09 MED ORDER — IOHEXOL 300 MG/ML  SOLN
100.0000 mL | Freq: Once | INTRAMUSCULAR | Status: AC | PRN
  Administered 2014-09-09: 100 mL via INTRAVENOUS

## 2014-09-20 ENCOUNTER — Ambulatory Visit (INDEPENDENT_AMBULATORY_CARE_PROVIDER_SITE_OTHER): Payer: 59 | Admitting: Internal Medicine

## 2014-09-20 ENCOUNTER — Encounter: Payer: Self-pay | Admitting: Internal Medicine

## 2014-09-20 VITALS — BP 118/76 | HR 82 | Temp 98.0°F | Ht 64.0 in | Wt 166.0 lb

## 2014-09-20 DIAGNOSIS — J4540 Moderate persistent asthma, uncomplicated: Secondary | ICD-10-CM

## 2014-09-20 DIAGNOSIS — F17210 Nicotine dependence, cigarettes, uncomplicated: Secondary | ICD-10-CM

## 2014-09-20 DIAGNOSIS — R918 Other nonspecific abnormal finding of lung field: Secondary | ICD-10-CM

## 2014-09-20 DIAGNOSIS — Z720 Tobacco use: Secondary | ICD-10-CM

## 2014-09-20 MED ORDER — BUDESONIDE-FORMOTEROL FUMARATE 160-4.5 MCG/ACT IN AERO: INHALATION_SPRAY | RESPIRATORY_TRACT | Status: DC

## 2014-09-20 NOTE — Patient Instructions (Addendum)
symbicort 160 Take 2 puffs first thing in am and then another 2 puffs about 12 hours later.   Work on inhaler technique:  relax and gently blow all the way out then take a nice smooth deep breath back in, triggering the inhaler at same time you start breathing in.  Hold for up to 5 seconds if you can.  Rinse and gargle with water when done  The key is to stop smoking completely before smoking completely stops you!   Please schedule a follow up office visit in 4 weeks, sooner if needed with cxr / spirometry on return

## 2014-09-20 NOTE — Progress Notes (Signed)
Subjective:     Patient ID: Kelli Lamb, female   DOB: Feb 17, 1971,  MRN: [id number]  HPI  40 yowf active smoker with new doe summer of 2015 with noct sob / dx as asthma rx symbiocort seemed to help but not using regularly then admitted to [facility]   Admit date: 07/05/2014 Discharge date: 07/10/2014  Discharge Diagnoses:  1. Probably perforated appendicitis with RLQ abscess Has perc drain placed 07/06/2014. 2. Smokes 3. Anemia - Hgb - 9.5 - 07/10/2014 4. Family history of colon cancer 5. Left adrenal adenoma  Principal Problem:  Acute appendicitis with perforation and peritoneal abscess Active Problems:  Tobacco abuse  Asthma, chronic  Insomnia  09/20/2014 1st [facility] office visit/ Kelli Lamb   Chief Complaint  Patient presents with  . Advice Only    Pt referred for multiple lung nodules by CT scan at [place]. Pt has some sob but denies cough, chest tightness and or wheezing. Pt has congestion and sore throat at night since 09/17/14.  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 No unusual exp hx or h/o childhood pna/ asthma or knowledge of premature birth.  Sleeping ok without nocturnal  or early am exacerbation  of respiratory  c/o's or need for noct saba. Also denies any obvious fluctuation of symptoms with weather or environmental changes or other aggravating or alleviating factors except as outlined above   Current Medications, Allergies, Complete Past Medical History, Past Surgical History, Family History, and Social History were reviewed in [employer] record.  ROS  The following  are not active complaints unless bolded sore throat, dysphagia, dental problems, itching, sneezing,  nasal congestion or excess/ purulent secretions, ear ache,   fever, chills, sweats, unintended wt loss, pleuritic or exertional cp, hemoptysis,  orthopnea pnd or leg swelling, presyncope, palpitations, heartburn, abdominal pain, anorexia, nausea, vomiting, diarrhea  or change in bowel or urinary habits, change in stools or urine, dysuria,hematuria,  rash, arthralgias, visual complaints, headache, numbness weakness or ataxia or problems with walking or coordination,  change in mood/affect or memory.            Review of Systems     Objective:   Physical Exam amb wf with nasal tone to voice nad  Wt Readings from Last 3 Encounters:  09/20/14 166 lb (75.297 kg)  07/06/14 163 lb 9.3 oz (74.2 kg)    Vital signs reviewed   HEENT: nl dentition, turbinates, and orophanx. Nl external ear canals without cough reflex   NECK :  without JVD/Nodes/TM/ nl carotid upstrokes bilaterally   LUNGS: no acc muscle use, insp and exp rhonchi quite prominent   CV:  RRR  no s3 or murmur or increase in P2, no edema   ABD:  soft and nontender with nl excursion in the supine position. No bruits or organomegaly, bowel sounds nl  MS:  warm without deformities, calf tenderness, cyanosis or clubbing  SKIN: warm and dry without lesions    NEURO:  alert, approp, no deficits    I personally reviewed images and agree with radiology impression as follows:     09/09/14  CT abd Vs 07/13/14   numerous small pulmonary nodules scattered throughout the lung bases bilaterally. These are new compared to the prior examination, and are presumably of infectious or inflammatory etiology likely disseminating via the airways, as they appear to be in a peribronchovascular distribution rather than a random distribution as would be seen with hematogenous dissemination of either septic emboli or metastatic disease.        Assessment:

## 2014-09-21 DIAGNOSIS — R918 Other nonspecific abnormal finding of lung field: Secondary | ICD-10-CM | POA: Insufficient documentation

## 2014-09-21 NOTE — Assessment & Plan Note (Signed)

## 2014-09-21 NOTE — Assessment & Plan Note (Signed)
Although there are clearly abnormalities on CT scan, they should probably be considered "microscopic" since not obvious on plain cxr and are not related to her present problem but are most likely completely benign/inflammatory having been absent on prev study 07/13/14    In the setting of obvious "macroscopic" health issues,  I am very reluctatnt to embark on an invasive w/u at this point but will arrange consevative  follow up and in the meantime see what we can do to address the patient's subjective concerns.    Plain cxr is all that's needed in f/u for now

## 2014-09-21 NOTE — Assessment & Plan Note (Signed)
Her airways are much more obstructed than her hx would suggest and she's either in denial or pacing herself intentionally at a level to avoid being more symptomatic.  DDX of  difficult airways management all start with A and  include Adherence, Ace Inhibitors, Acid Reflux, Active Sinus Disease, Alpha 1 Antitripsin deficiency, Anxiety masquerading as Airways dz,  ABPA,  allergy(esp in young), Aspiration (esp in elderly), Adverse effects of DPI,  Active smokers, plus two Bs  = Bronchiectasis and Beta blocker use..and one C= CHF  Adherence is always the initial "prime suspect" and is a multilayered concern that requires a "trust but verify" approach in every patient - starting with knowing how to use medications, especially inhalers, correctly, keeping up with refills and understanding the fundamental difference between maintenance and prns vs those medications only taken for a very short course and then stopped and not refilled.  The proper method of use, as well as anticipated side effects, of a metered-dose inhaler are discussed and demonstrated to the patient. Improved effectiveness after extensive coaching during this visit to a level of approximately  75% > try symbicort 160 2bid  Active smoking > see sep a/p  Will bring back in 4 weeks for spirometry

## 2014-10-07 ENCOUNTER — Telehealth: Payer: Self-pay | Admitting: *Deleted

## 2014-10-07 NOTE — Telephone Encounter (Signed)
Received fax for PA on symbicort to go through cover my meds Key:[id number]  Called pt to advise her at her 10/18/14 appt to bring her formulary to visit and to give a sample if needed to get her to appt. Line was busy x 3 WCB Will forward to leslie to f/u on

## 2014-10-13 NOTE — Telephone Encounter (Signed)
PA for Symbicort has been denied. Case No. [id number]  Patient needs to try and fail the following : Advair, [place]

## 2014-10-17 NOTE — Telephone Encounter (Signed)
dulera 200 2  Bid is same drug

## 2014-10-18 MED ORDER — MOMETASONE FURO-FORMOTEROL FUM 200-5 MCG/ACT IN AERO: 2.0000 | INHALATION_SPRAY | Freq: Two times a day (BID) | RESPIRATORY_TRACT | Status: DC

## 2014-10-18 NOTE — Telephone Encounter (Signed)
Spoke with patient-aware of change in inhalers per MW request and insurance formulary. Pt requested we send Rx for Dulera 200/5 to [facility]. This has been taken care of and mediation list updated in EPIC. Nothing more needed at this time.

## 2014-10-20 ENCOUNTER — Ambulatory Visit: Payer: 59 | Admitting: Internal Medicine

## 2014-10-21 ENCOUNTER — Ambulatory Visit (INDEPENDENT_AMBULATORY_CARE_PROVIDER_SITE_OTHER)
Admission: RE | Admit: 2014-10-21 | Discharge: 2014-10-21 | Disposition: A | Discharge status: Home / Self Care | Payer: 59 | Source: Ambulatory Visit | Attending: Internal Medicine | Admitting: Internal Medicine

## 2014-10-21 ENCOUNTER — Ambulatory Visit (INDEPENDENT_AMBULATORY_CARE_PROVIDER_SITE_OTHER): Payer: 59 | Admitting: Internal Medicine

## 2014-10-21 ENCOUNTER — Encounter: Payer: Self-pay | Admitting: Internal Medicine

## 2014-10-21 VITALS — BP 110/68 | HR 81 | Ht 64.0 in | Wt 167.0 lb

## 2014-10-21 DIAGNOSIS — R918 Other nonspecific abnormal finding of lung field: Secondary | ICD-10-CM

## 2014-10-21 DIAGNOSIS — J4530 Mild persistent asthma, uncomplicated: Secondary | ICD-10-CM

## 2014-10-21 MED ORDER — PREDNISONE 10 MG PO TABS: ORAL_TABLET | ORAL | Status: DC

## 2014-10-21 NOTE — Progress Notes (Signed)
Subjective:     Patient ID: Kelli Lamb, female   DOB: July 08, 1971,  MRN: [id number]    Brief patient profile:  25 yowf active smoker with new doe summer of 2015 with noct sob / dx as asthma rx symbiocort seemed to help but not using regularly then admitted to [facility]   Admit date: 07/05/2014 Discharge date: 07/10/2014  Discharge Diagnoses:  1. Probably perforated appendicitis with RLQ abscess Has perc drain placed 07/06/2014. 2. Smokes 3. Anemia - Hgb - 9.5 - 07/10/2014 4. Family history of colon cancer 5. Left adrenal adenoma  Principal Problem:  Acute appendicitis with perforation and peritoneal abscess Active Problems:  Tobacco abuse  Asthma, chronic  Insomnia   History of Present Illness  09/20/2014 1st [facility] office visit/ Wert   Chief Complaint  Patient presents with  . Advice Only    Pt referred for multiple lung nodules by CT scan at [place]. Pt has some sob but denies cough, chest tightness and or wheezing. Pt has congestion and sore throat at night since 09/17/14.  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  Work on inhaler technique:  The key is to stop smoking completely before smoking completely stops you!  Please schedule a follow up office visit in 4 weeks, sooner if needed with cxr / spirometry on return     10/21/2014 f/u ov/Wert re: still smoking / maint on symbicort but container empty  Chief Complaint  Patient presents with  . Follow-up    Allergies have been really bad; coughing some.  Patient did not pick up [facility], it was too expensive, wants something different.  eye irritation x 1 week / nasal congestion    Not  limited by breathing from desired activities saba use =zero  symbicort empty    No obvious day to day or daytime variabilty or assoc chronic cough or cp or chest tightness, subjective wheeze overt  hb symptoms. No unusual exp hx or h/o childhood pna/ asthma or knowledge of premature birth.  Sleeping ok without nocturnal  or early am exacerbation  of respiratory  c/o's or need for noct saba. Also denies any obvious fluctuation of symptoms with weather or environmental changes or other aggravating or alleviating factors except as outlined above   Current Medications, Allergies, Complete Past Medical History, Past Surgical History, Family History, and Social History were reviewed in [employer] record.  ROS  The following are not active complaints unless bolded sore throat, dysphagia, dental problems, itching, sneezing,  nasal congestion or excess/ purulent secretions, ear ache,   fever, chills, sweats, unintended wt loss, pleuritic or exertional cp, hemoptysis,  orthopnea pnd or leg swelling, presyncope, palpitations, heartburn, abdominal pain, anorexia, nausea, vomiting, diarrhea  or change in bowel or urinary habits, change in stools or urine, dysuria,hematuria,  rash, arthralgias, visual complaints, headache, numbness weakness or ataxia or problems with walking or coordination,  change in mood/affect or memory.                 Objective:   Physical Exam   amb wf with nad   10/21/14  167  Wt Readings from Last 3 Encounters:  09/20/14 166 lb (75.297 kg)  07/06/14 163 lb 9.3 oz (74.2 kg)    Vital signs reviewed   HEENT: nl dentition, turbinates, and orophanx. Nl external ear canals without cough reflex   NECK :  without JVD/Nodes/TM/ nl carotid upstrokes bilaterally   LUNGS: no acc muscle use, insp and exp rhonchi resolved/ lungs are clear    CV:  RRR  no s3 or murmur or increase in P2, no edema   ABD:  soft and nontender with nl excursion in  the supine position. No bruits or organomegaly, bowel sounds nl  MS:  warm without deformities, calf tenderness, cyanosis or clubbing  SKIN: warm and dry without lesions    NEURO:  alert, approp, no deficits    I personally reviewed images and agree with radiology impression as follows:   cxr 10/21/14 1. Previously identified multiple miniscule pulmonary nodules may remain. These are difficult to detect by plain chest x-ray.  .2. No acute infiltrate. Heart size stable      Assessment:

## 2014-10-21 NOTE — Patient Instructions (Addendum)
The key is to stop smoking completely before smoking completely stops you!   Continue symbicort 160 Take 2 puffs first thing in am and then another 2 puffs about 12 hours later - call before runs out if having trouble filling it and we will contact the company to be sure they honor the 25 dollar guarantee   Work on inhaler technique:  relax and gently blow all the way out then take a nice smooth deep breath back in, triggering the inhaler at same time you start breathing in.  Hold for up to 5 seconds if you can then out through the nose.  Rinse and gargle with water when done  Zyrtec 10 mg at bedtime as needed for allergies  If still bothered > Prednisone 10 mg take  4 each am x 2 days,   2 each am x 2 days,  1 each am x 2 days and stop  Please remember to go to the x-ray department downstairs for your tests - we will call you with the results when they are available.  Please schedule a follow up office visit in 6 weeks, call sooner if needed with pfts on return

## 2014-10-21 NOTE — Progress Notes (Signed)
Quick Note:  LMTCB ______ 

## 2014-10-25 ENCOUNTER — Encounter: Payer: Self-pay | Admitting: Internal Medicine

## 2014-10-25 NOTE — Progress Notes (Signed)
Quick Note:  LMTCB ______ 

## 2014-10-25 NOTE — Assessment & Plan Note (Addendum)
I had an extended discussion with the patient reviewing all relevant studies completed to date and  lasting 15 to 20 minutes of a 25 minute visit on the following ongoing concerns:   1) The proper method of use, as well as anticipated side effects, of a metered-dose inhaler are discussed and demonstrated to the patient. Improved effectiveness after extensive coaching during this visit to a level of approximately  75% so needs to keep working on it  2) I  reviewed the Flethcher curve with patient that basically indicates  if you quit smoking when your best day FEV1 is still well preserved (as appears to be   the case here)  it is highly unlikely you will progress to severe disease and informed the patient there was no medication on the market that has proven to change the curve or the likelihood of progression.  Therefore stopping smoking and maintaining abstinence is the most important aspect of care, not choice of inhalers or for that matter, doctors.    3) rec zyrtec for upper airway symptoms and if not better > Prednisone 10 mg take  4 each am x 2 days,   2 each am x 2 days,  1 each am x 2 days and stop   4) f/u in 6 weeks with pfts to be sure not developing copd

## 2014-10-27 ENCOUNTER — Telehealth: Payer: Self-pay | Admitting: Internal Medicine

## 2014-10-27 NOTE — Telephone Encounter (Signed)
Notes Recorded by Tanda Rockers, MD on 10/21/2014 at 1:21 PM Call pt: Reviewed cxr and no acute change so no change in recommendations made at ov ----------------------- Pt aware of results.  Nothing further needed.

## 2014-11-02 ENCOUNTER — Other Ambulatory Visit: Payer: Self-pay | Admitting: Obstetrics and Gynecology

## 2014-11-02 NOTE — H&P (Signed)
CC : Fibroids Abnormal Uterine Bleeding       History of Present Illness  General:  44 y/o presents complaining of  menorrhagia, endometrial mass and dysmenorrhea . She has a h/o pelvic abscess.she was interested in hysterctomy at the time of general surgery for appendectomy if it was deemed necessary. However, her general surgeon does not think surgery is necessary as this abscess is thought to result from diverticulitis instead of ruptured appendix.  Her ultrasound performed 09/15/2014 shows a 8.44 cm x 6.4 cm x 5.39 cm uterus. The endometrium is 1.4 cm there is a hyperechoic mass at the fundal aspect of the uterus that is 2.6 cm no blood flow is noted. Her ovaries are normal bilaterally.    Current Medications  Taking   Symbicort(Budesonide-Formoterol Fumarate) 160-4.5 MCG/ACT Aerosol 2 puffs twice a day as needed   Ambien(Zolpidem Tartrate) 10 MG Tablet 1 tablet at bedtime as needed Once a day   Celexa(Citalopram Hydrobromide) 10 MG Tablet 1 tablet Once a day   Medication List reviewed and reconciled with the patient    Past Medical History  Asthma  History of anemia  Depression  Diverticulitis   Surgical History  tubal ligation   tonsillectomy   wisdom teeth extraction   drain in stomach from abcess and pertonitis 06/2014   Family History  Father: alive, colorectal cancer, liver cancer, diagnosed with Lung Ca, Colon Ca  Mother: alive, polyps, diagnosed with COPD  Paternal [place] Father: deceased, emphysema, smoker  Paternal Grand Mother: deceased, diagnosed with CVA  Maternal Grand Father: deceased, smoker, diagnosed with Lung Ca  Maternal Grand Mother: deceased, kidney failure  Brother 1: alive, colon polyps, diverticulitis  1 brother(s) . 1 son(s) , 2 daughter(s) .   denies family h/o gyn cancers.   Social History  General:  Tobacco use cigarettes: Current smoker, Frequency: 1/2 PPD, Tobacco history last updated 09/15/2014. Smoking: yes. Alcohol: yes, Rare, wine.  Caffeine: yes, coffee, 2+ servings daily. no Recreational drug use, no. Diet: regular, drinks water. Exercise: nothing structured but active at job. Occupation: mail carrier. Education: Associate Degree. Marital Status: Married. Children: 1- boy, 2- girls. Firearms: no. Seat belt use: yes. Home smoke detector use: yes.    Gyn History  Sexual activity currently sexually active.  Periods : every month.  LMP 08/29/14.  Birth control BTL.  Last pap smear date 04/2014.  Last mammogram date 04/2014.  Abnormal pap smear none.  Menarche 20.    OB History  Number of pregnancies 5.  Pregnancy # 1 miscarriage.  Pregnancy # 2 live birth, vaginal delivery.  Pregnancy # 3 miscarriage.  Pregnancy # 4: live birth, vaginal delivery.  Pregnancy # 5: Live Birth, vaginal delivery.    Allergies  Wellbutrin: rash   Hospitalization/Major Diagnostic Procedure   Pelvic Absces due to diverticulitis  with perforation 06/2014  child birth x 3    Review of Systems  Urinary frequency otherwise Negative except as stated in HPI.     Vital Signs  Wt 166, Wt change 2.8 lb, Pulse sitting 79, BP sitting 107/68.   Examination  General Examination: GENERAL APPEARANCE alert, oriented, NAD. LUNGS: good I:E efffort noted, clear to auscultation bilaterally. HEART: regular rate and rhythm. ABDOMEN: no guarding. FEMALE GENITOURINARY: normal external genitalia, labia - unremarkable, vagina - pink moist mucosa, no lesions or abnormal discharge, cervix - no discharge or lesions or CMT, adnexa - no masses or tenderness, uterus - nontender and normal size on palpation.. the uterus is not very  mobile . EXTREMITIES: normal range of motion, no edema present. NEUROLOGIC EXAM: alert and oriented x 3.     Assessments   1. Endometrial mass - N94.89 (Primary)   2. Urinary frequency - R35.0   3. Thickened endometrium - R93.8   4. Menorrhagia with regular cycle - N92.0   5. Leiomyoma of uterus, unspecified - D25.9    Treatment  1. Endometrial mass  Notes: I discussed with Ms. Ephraim options of management. hysterectomy vs hysteroscopy with D&C removal of endometrial mass and novasure ablation. she is advised if she opts for hysterectomy I would refer her to gyn oncology as she may have dense adhesions from recent pelvic abscess... d/w her the r/o hysterectomy as well as the risk of hysteroscopy with removal of endometrial mass with novasure ablation.. she is interested in hysteroscopy with removal of endometrial mass and novasure ablation.        2. Thickened endometrium  Notes: D&C at time of hysteroscopy planned.    3 Menorrhagia with regular cycle  Notes: I discussed with Ms. Douthitt options of management. hysterectomy vs hysteroscopy with D&C removal of endometrial mass and novasure ablation. she is advised if she opts for hysterectomy I would refer her to gyn oncology as she may have dense adhesions from recent pelvic abscess... d/w her the r/o hysterectomy as well as the risk of hysteroscopy with removal of endometrial mass with novasure ablation.. she is interested in hysteroscopy with removal of endometrial mass and novasure ablation

## 2014-11-03 ENCOUNTER — Encounter (HOSPITAL_COMMUNITY): Payer: Self-pay | Admitting: Anesthesiology

## 2014-11-03 ENCOUNTER — Ambulatory Visit (HOSPITAL_COMMUNITY): Payer: 59 | Admitting: Anesthesiology

## 2014-11-03 ENCOUNTER — Encounter (HOSPITAL_COMMUNITY)
Admission: RE | Disposition: A | Discharge status: Home / Self Care | Payer: Self-pay | Source: Ambulatory Visit | Attending: Obstetrics and Gynecology

## 2014-11-03 ENCOUNTER — Ambulatory Visit (HOSPITAL_COMMUNITY)
Admission: RE | Admit: 2014-11-03 | Discharge: 2014-11-03 | Disposition: A | Discharge status: Home / Self Care | Payer: 59 | Source: Ambulatory Visit | Attending: Obstetrics and Gynecology | Admitting: Obstetrics and Gynecology

## 2014-11-03 DIAGNOSIS — N840 Polyp of corpus uteri: Secondary | ICD-10-CM | POA: Diagnosis not present

## 2014-11-03 DIAGNOSIS — N907 Vulvar cyst: Secondary | ICD-10-CM | POA: Insufficient documentation

## 2014-11-03 DIAGNOSIS — Z888 Allergy status to other drugs, medicaments and biological substances status: Secondary | ICD-10-CM | POA: Diagnosis not present

## 2014-11-03 DIAGNOSIS — G4700 Insomnia, unspecified: Secondary | ICD-10-CM | POA: Insufficient documentation

## 2014-11-03 DIAGNOSIS — Z9049 Acquired absence of other specified parts of digestive tract: Secondary | ICD-10-CM | POA: Insufficient documentation

## 2014-11-03 DIAGNOSIS — N9489 Other specified conditions associated with female genital organs and menstrual cycle: Secondary | ICD-10-CM | POA: Diagnosis present

## 2014-11-03 DIAGNOSIS — F17210 Nicotine dependence, cigarettes, uncomplicated: Secondary | ICD-10-CM | POA: Insufficient documentation

## 2014-11-03 HISTORY — PX: DILATATION & CURETTAGE/HYSTEROSCOPY WITH MYOSURE: SHX6511

## 2014-11-03 HISTORY — PX: DILITATION & CURRETTAGE/HYSTROSCOPY WITH NOVASURE ABLATION: SHX5568

## 2014-11-03 LAB — CBC
HCT: 37.9 % (ref 36.0–46.0)
HEMOGLOBIN: 12.3 g/dL (ref 12.0–15.0)
MCH: 27.8 pg (ref 26.0–34.0)
MCHC: 32.5 g/dL (ref 30.0–36.0)
MCV: 85.7 fL (ref 78.0–100.0)
PLATELETS: 223 10*3/uL (ref 150–400)
RBC: 4.42 MIL/uL (ref 3.87–5.11)
RDW: 14 % (ref 11.5–15.5)
WBC: 7.1 10*3/uL (ref 4.0–10.5)

## 2014-11-03 SURGERY — DILATATION & CURETTAGE/HYSTEROSCOPY WITH NOVASURE ABLATION
Anesthesia: General

## 2014-11-03 MED ORDER — SCOPOLAMINE 1 MG/3DAYS TD PT72
MEDICATED_PATCH | TRANSDERMAL | Status: AC
  Administered 2014-11-03: 1.5 mg via TRANSDERMAL
  Filled 2014-11-03: qty 1

## 2014-11-03 MED ORDER — HYDROCODONE-ACETAMINOPHEN 5-325 MG PO TABS
1.0000 | ORAL_TABLET | Freq: Once | ORAL | Status: AC
  Administered 2014-11-03: 1 via ORAL

## 2014-11-03 MED ORDER — EPHEDRINE 5 MG/ML INJ
INTRAVENOUS | Status: AC
  Filled 2014-11-03: qty 10

## 2014-11-03 MED ORDER — DEXAMETHASONE SODIUM PHOSPHATE 4 MG/ML IJ SOLN
INTRAMUSCULAR | Status: DC | PRN
  Administered 2014-11-03: 4 mg via INTRAVENOUS

## 2014-11-03 MED ORDER — BUPIVACAINE HCL (PF) 0.25 % IJ SOLN
INTRAMUSCULAR | Status: DC | PRN
  Administered 2014-11-03: 20 mL

## 2014-11-03 MED ORDER — FENTANYL CITRATE (PF) 100 MCG/2ML IJ SOLN
INTRAMUSCULAR | Status: DC | PRN
  Administered 2014-11-03 (×2): 100 ug via INTRAVENOUS

## 2014-11-03 MED ORDER — IBUPROFEN 800 MG PO TABS: ORAL_TABLET | ORAL | Status: AC

## 2014-11-03 MED ORDER — MEPERIDINE HCL 25 MG/ML IJ SOLN: 6.2500 mg | INTRAMUSCULAR | Status: DC | PRN

## 2014-11-03 MED ORDER — FENTANYL CITRATE (PF) 100 MCG/2ML IJ SOLN
INTRAMUSCULAR | Status: AC
  Filled 2014-11-03: qty 2

## 2014-11-03 MED ORDER — HYDROCODONE-ACETAMINOPHEN 7.5-325 MG PO TABS
ORAL_TABLET | ORAL | Status: DC
Start: 2014-11-03 — End: 2014-11-03
  Filled 2014-11-03: qty 1

## 2014-11-03 MED ORDER — LIDOCAINE HCL (CARDIAC) 20 MG/ML IV SOLN
INTRAVENOUS | Status: DC | PRN
  Administered 2014-11-03: 80 mg via INTRAVENOUS

## 2014-11-03 MED ORDER — ONDANSETRON HCL 4 MG/2ML IJ SOLN: 4.0000 mg | Freq: Once | INTRAMUSCULAR | Status: DC | PRN

## 2014-11-03 MED ORDER — EPHEDRINE SULFATE 50 MG/ML IJ SOLN
INTRAMUSCULAR | Status: DC | PRN
  Administered 2014-11-03: 5 mg via INTRAVENOUS

## 2014-11-03 MED ORDER — HYDROCODONE-ACETAMINOPHEN 5-325 MG PO TABS
ORAL_TABLET | ORAL | Status: AC
  Filled 2014-11-03: qty 1

## 2014-11-03 MED ORDER — PROPOFOL 10 MG/ML IV BOLUS
INTRAVENOUS | Status: AC
  Filled 2014-11-03: qty 20

## 2014-11-03 MED ORDER — MIDAZOLAM HCL 2 MG/2ML IJ SOLN
INTRAMUSCULAR | Status: AC
  Filled 2014-11-03: qty 2

## 2014-11-03 MED ORDER — HYDROCODONE-ACETAMINOPHEN 5-325 MG PO TABS: 1.0000 | ORAL_TABLET | Freq: Four times a day (QID) | ORAL | Status: DC | PRN

## 2014-11-03 MED ORDER — KETOROLAC TROMETHAMINE 30 MG/ML IJ SOLN
30.0000 mg | Freq: Once | INTRAMUSCULAR | Status: AC | PRN
  Administered 2014-11-03: 30 mg via INTRAVENOUS

## 2014-11-03 MED ORDER — ONDANSETRON HCL 4 MG/2ML IJ SOLN
INTRAMUSCULAR | Status: DC | PRN
  Administered 2014-11-03: 4 mg via INTRAVENOUS

## 2014-11-03 MED ORDER — PROPOFOL 10 MG/ML IV BOLUS
INTRAVENOUS | Status: DC | PRN
  Administered 2014-11-03: 200 mg via INTRAVENOUS
  Administered 2014-11-03 (×2): 100 mg via INTRAVENOUS

## 2014-11-03 MED ORDER — SCOPOLAMINE 1 MG/3DAYS TD PT72
1.0000 | MEDICATED_PATCH | Freq: Once | TRANSDERMAL | Status: DC
  Administered 2014-11-03: 1.5 mg via TRANSDERMAL

## 2014-11-03 MED ORDER — MIDAZOLAM HCL 5 MG/5ML IJ SOLN
INTRAMUSCULAR | Status: DC | PRN
  Administered 2014-11-03: 2 mg via INTRAVENOUS

## 2014-11-03 MED ORDER — BUPIVACAINE HCL (PF) 0.25 % IJ SOLN
INTRAMUSCULAR | Status: AC
  Filled 2014-11-03: qty 30

## 2014-11-03 MED ORDER — LIDOCAINE HCL (CARDIAC) 20 MG/ML IV SOLN
INTRAVENOUS | Status: AC
  Filled 2014-11-03: qty 5

## 2014-11-03 MED ORDER — FENTANYL CITRATE (PF) 100 MCG/2ML IJ SOLN
25.0000 ug | INTRAMUSCULAR | Status: DC | PRN
  Administered 2014-11-03: 50 ug via INTRAVENOUS

## 2014-11-03 MED ORDER — KETOROLAC TROMETHAMINE 30 MG/ML IJ SOLN
INTRAMUSCULAR | Status: AC
  Filled 2014-11-03: qty 1

## 2014-11-03 MED ORDER — SUCCINYLCHOLINE CHLORIDE 20 MG/ML IJ SOLN
INTRAMUSCULAR | Status: DC | PRN
  Administered 2014-11-03: 20 mg via INTRAVENOUS

## 2014-11-03 MED ORDER — DOXYCYCLINE HYCLATE 50 MG PO CAPS: ORAL_CAPSULE | ORAL | Status: DC

## 2014-11-03 MED ORDER — SUCCINYLCHOLINE CHLORIDE 20 MG/ML IJ SOLN
INTRAMUSCULAR | Status: AC
  Filled 2014-11-03: qty 1

## 2014-11-03 MED ORDER — ONDANSETRON HCL 4 MG/2ML IJ SOLN
INTRAMUSCULAR | Status: AC
  Filled 2014-11-03: qty 2

## 2014-11-03 MED ORDER — DEXAMETHASONE SODIUM PHOSPHATE 4 MG/ML IJ SOLN
INTRAMUSCULAR | Status: AC
  Filled 2014-11-03: qty 1

## 2014-11-03 MED ORDER — GLYCOPYRROLATE 0.2 MG/ML IJ SOLN
INTRAMUSCULAR | Status: DC | PRN
  Administered 2014-11-03: 0.1 mg via INTRAVENOUS

## 2014-11-03 MED ORDER — LACTATED RINGERS IV SOLN
INTRAVENOUS | Status: DC
  Administered 2014-11-03 (×2): via INTRAVENOUS

## 2014-11-03 MED ORDER — SILVER NITRATE-POT NITRATE 75-25 % EX MISC
CUTANEOUS | Status: AC
  Filled 2014-11-03: qty 1

## 2014-11-03 SURGICAL SUPPLY — 18 items
ABLATOR ENDOMETRIAL BIPOLAR (ABLATOR) ×4 IMPLANT
CATH ROBINSON RED A/P 16FR (CATHETERS) ×4 IMPLANT
CLOTH BEACON ORANGE TIMEOUT ST (SAFETY) ×4 IMPLANT
CONTAINER PREFILL 10% NBF 60ML (FORM) IMPLANT
GLOVE BIOGEL M 6.5 STRL (GLOVE) ×8 IMPLANT
GLOVE BIOGEL PI IND STRL 6.5 (GLOVE) ×6 IMPLANT
GLOVE BIOGEL PI INDICATOR 6.5 (GLOVE) ×6
GOWN STRL REUS W/TWL LRG LVL3 (GOWN DISPOSABLE) ×8 IMPLANT
MYOSURE XL FIBROID REM (MISCELLANEOUS) ×4
PACK VAGINAL MINOR WOMEN LF (CUSTOM PROCEDURE TRAY) ×4 IMPLANT
PAD OB MATERNITY 4.3X12.25 (PERSONAL CARE ITEMS) ×4 IMPLANT
PAD PREP 24X48 CUFFED NSTRL (MISCELLANEOUS) ×4 IMPLANT
SEAL ROD LENS SCOPE MYOSURE (ABLATOR) ×4 IMPLANT
SYSTEM TISS REMOVAL MYSR XL RM (MISCELLANEOUS) ×2 IMPLANT
TOWEL OR 17X24 6PK STRL BLUE (TOWEL DISPOSABLE) ×8 IMPLANT
TUBING AQUILEX INFLOW (TUBING) ×4 IMPLANT
TUBING AQUILEX OUTFLOW (TUBING) ×4 IMPLANT
WATER STERILE IRR 1000ML POUR (IV SOLUTION) ×4 IMPLANT

## 2014-11-03 NOTE — H&P (Signed)
Date of Initial H&P: 11/02/2014 History reviewed, patient examined, no change in status, stable for surgery.

## 2014-11-03 NOTE — Anesthesia Procedure Notes (Signed)
Procedure Name: LMA Insertion Date/Time: 11/03/2014 12:42 PM Performed by: Flossie Dibble Pre-anesthesia Checklist: Patient identified, Timeout performed, Emergency Drugs available, Patient being monitored and Suction available Patient Re-evaluated:Patient Re-evaluated prior to inductionOxygen Delivery Method: Circle system utilized Preoxygenation: Pre-oxygenation with 100% oxygen Intubation Type: IV induction LMA: LMA inserted LMA Size: 4.0 Number of attempts: 1 Placement Confirmation: breath sounds checked- equal and bilateral and positive ETCO2 Tube secured with: Tape Dental Injury: Teeth and Oropharynx as per pre-operative assessment

## 2014-11-03 NOTE — Discharge Instructions (Signed)
DISCHARGE INSTRUCTIONS: HYSTEROSCOPY / ENDOMETRIAL ABLATION The following instructions have been prepared to help you care for yourself upon your return home.  May Remove Scop patch on or before  May take Ibuprofen after  May take stool softner while taking narcotic pain medication to prevent constipation.  Drink plenty of water.  Personal hygiene:  Use sanitary pads for vaginal drainage, not tampons.  Shower the day after your procedure.  NO tub baths, pools or Jacuzzis for 2-3 weeks.  Wipe front to back after using the bathroom.  Activity and limitations:  Do NOT drive or operate any equipment for 24 hours. The effects of anesthesia are still present and drowsiness may result.  Do NOT rest in bed all day.  Walking is encouraged.  Walk up and down stairs slowly.  You may resume your normal activity in one to two days or as indicated by your physician. Sexual activity: NO intercourse for at least 2 weeks after the procedure, or as indicated by your Doctor.  Diet: Eat a light meal as desired this evening. You may resume your usual diet tomorrow.  Return to Work: You may resume your work activities in one to two days or as indicated by Marine scientist.  What to expect after your surgery: Expect to have vaginal bleeding/discharge for 2-3 days and spotting for up to 10 days. It is not unusual to have soreness for up to 1-2 weeks. You may have a slight burning sensation when you urinate for the first day. Mild cramps may continue for a couple of days. You may have a regular period in 2-6 weeks.  NO IBUPROFEN PRODUCTS (MOTRIN, ADVIL) OR ALEVE UNTIL 8:15PM TODAY.   Call your doctor for any of the following:  Excessive vaginal bleeding or clotting, saturating and changing one pad every hour.  Inability to urinate 6 hours after discharge from hospital.  Pain not relieved by pain medication.  Fever of 100.4 F or greater.  Unusual vaginal discharge or odor.  Return to  office _________________Call for an appointment ___________________ Patients signature: ______________________ Nurses signature ________________________  [facility] [phone]

## 2014-11-03 NOTE — Op Note (Signed)
11/03/2014  1:42 PM  PATIENT:  Kelli Lamb  44 y.o. female  PRE-OPERATIVE DIAGNOSIS:  N94.89  Endometrial Mass  POST-OPERATIVE DIAGNOSIS:  N94.89  Endometrial Mass  PROCEDURE:  Procedure(s) with comments: DILATATION & CURETTAGE/HYSTEROSCOPY WITH NOVASURE ABLATION (N/A) - Also Myosure/Removal Of Endometrial Mass DILATATION & CURETTAGE/HYSTEROSCOPY WITH MYOSURE  SURGEON:  Surgeon(s) and Role:    * Christophe Louis, MD - Primary  PHYSICIAN ASSISTANT: None  ASSISTANTS: none   ANESTHESIA:   MAC  EBL:  Total I/O In: 1000 [I.V.:1000] Out: 100 [Urine:100]  BLOOD ADMINISTERED:none  DRAINS: none   LOCAL MEDICATIONS USED:  MARCAINE     SPECIMEN:  Source of Specimen:  endometrial mass susspected polyp vs fibroid and endometrial currettings   DISPOSITION OF SPECIMEN:  PATHOLOGY  COUNTS:  YES  TOURNIQUET:  * No tourniquets in log *  DICTATION: .Dragon Dictation  PLAN OF CARE: Discharge to home after PACU  PATIENT DISPOSITION:  PACU - hemodynamically stable.   Delay start of Pharmacological VTE agent (>24hrs) due to surgical blood loss or risk of bleeding: not applicable  Findings: 2 cm Right labial cyst that appears benign....2.5 cm endometrial polyp. Cervix appears normal   Procedure: Patient was taken to the operating room where she was placed under general anesthesia. She was placed in the dorsal lithotomy position. She was prepped and draped in the usual sterile fashion. A speculum was placed into the vaginal vault. The anterior lip of the cervix was grasped with a single-tooth tenaculum. Quarter percent Marcaine was injected at the 4 and 8:00 positions of the cervix. The cervix was then sounded to 7.5 cm. The endocervical canal measures 3.5 cm.  The cervix was dilated to approximately 7 mm. Diagnostic hysteroscope was performed with  The findings noted above.  The polyp was resected with  Myosure device.  The  Hysteroscope was removed. A Sharp curet was introduced and endometrial  curettings were obtained.   The Novosure was insered. Canal length was set at 4.0 cm. The width was 4.5 cm. The cavity assessment test passed. The Ablation was performed at a power of 99 for 120 seconds.The novasure was removed. . The single-tooth tenaculum was removed from the anterior lip of the cervix. Excellent hemostasis was noted. The speculum was removed from the patient's vagina. She was awakened from anesthesia taken care to the recovery room awake and in stable condition. Sponge lap and needle counts were correct x2.  The normal saline deficit was 200 cc.

## 2014-11-03 NOTE — Transfer of Care (Signed)
Immediate Anesthesia Transfer of Care Note  Patient: Taneil Lazarus  Procedure(s) Performed: Procedure(s) with comments: DILATATION & CURETTAGE/HYSTEROSCOPY WITH NOVASURE ABLATION (N/A) - Also Myosure/Removal Of Endometrial Mass DILATATION & CURETTAGE/HYSTEROSCOPY WITH MYOSURE  Patient Location: PACU  Anesthesia Type:General  Level of Consciousness: awake, alert  and oriented  Airway & Oxygen Therapy: Patient Spontanous Breathing and Patient connected to nasal cannula oxygen  Post-op Assessment: Report given to RN and Post -op Vital signs reviewed and stable  Post vital signs: Reviewed and stable  Last Vitals:  Filed Vitals:   11/03/14 1116  BP: 106/59  Pulse: 65  Temp: 36.8 C  Resp: 18    Complications: No apparent anesthesia complications

## 2014-11-03 NOTE — Anesthesia Postprocedure Evaluation (Signed)
  Anesthesia Post-op Note  Patient: Kelli Lamb  Procedure(s) Performed: Procedure(s) with comments: DILATATION & CURETTAGE/HYSTEROSCOPY WITH NOVASURE ABLATION (N/A) - Also Myosure/Removal Of Endometrial Mass DILATATION & CURETTAGE/HYSTEROSCOPY WITH MYOSURE  Patient Location: PACU  Anesthesia Type:General  Level of Consciousness: awake, alert  and oriented  Airway and Oxygen Therapy: Patient Spontanous Breathing  Post-op Pain: mild  Post-op Assessment: Post-op Vital signs reviewed, Patient's Cardiovascular Status Stable, Respiratory Function Stable, Patent Airway and No signs of Nausea or vomiting  Post-op Vital Signs: Reviewed and stable  Last Vitals:  Filed Vitals:   11/03/14 1430  BP: 103/45  Pulse: 62  Temp:   Resp: 18    Complications: No apparent anesthesia complications

## 2014-11-03 NOTE — Anesthesia Preprocedure Evaluation (Signed)
Anesthesia Evaluation  Patient identified by MRN, date of birth, ID band Patient awake    Reviewed: Allergy & Precautions, H&P , NPO status , Patient's Chart, lab work & pertinent test results  Airway Mallampati: I  TM Distance: >3 FB Neck ROM: full    Dental  (+) Teeth Intact   Pulmonary Current Smoker,    Pulmonary exam normal       Cardiovascular negative cardio ROS      Neuro/Psych negative neurological ROS  negative psych ROS   GI/Hepatic negative GI ROS, Neg liver ROS,   Endo/Other  negative endocrine ROS  Renal/GU negative Renal ROS     Musculoskeletal   Abdominal Normal abdominal exam  (+)   Peds  Hematology negative hematology ROS (+)   Anesthesia Other Findings   Reproductive/Obstetrics negative OB ROS                             Anesthesia Physical Anesthesia Plan  ASA: II  Anesthesia Plan: General   Post-op Pain Management:    Induction: Intravenous  Airway Management Planned: LMA  Additional Equipment:   Intra-op Plan:   Post-operative Plan: Extubation in OR  Informed Consent: I have reviewed the patients History and Physical, chart, labs and discussed the procedure including the risks, benefits and alternatives for the proposed anesthesia with the patient or authorized representative who has indicated his/her understanding and acceptance.     Plan Discussed with: CRNA and Surgeon  Anesthesia Plan Comments:         Anesthesia Quick Evaluation

## 2014-11-04 ENCOUNTER — Encounter (HOSPITAL_COMMUNITY): Payer: Self-pay | Admitting: Obstetrics and Gynecology

## 2014-11-29 ENCOUNTER — Encounter: Payer: Self-pay | Admitting: Internal Medicine

## 2014-12-02 ENCOUNTER — Ambulatory Visit: Payer: 59 | Admitting: Internal Medicine

## 2015-08-09 IMAGING — XA IR FISTULA/SINUS TRACT
1 series · 4 of 4 positions shown · non-contrast
Comparison: none

CLINICAL DATA: 43-year-old female with a history of perforated
appendicitis versus cecal diverticulitis complicated by right lower
quadrant abscess and significant surrounding inflammation. She was
treated by placement of a percutaneous drainage catheter on
07/06/2014. Subsequently, she developed fistulization with the
adjacent cecum. She was last seen in the [REDACTED] on
07/13/2014 where she had a small residual fistula. At that time, she
was converted from Khaya Samantha drainage to gravity drainage and flushes
worse Doppler.
TECHNIQUE: Informed consent was obtained from the patient following explanation
of the procedure, risks, benefits and alternatives. The patient
understands, agrees and consents for the procedure. All questions
were addressed. A time out was performed.

A gentle hand injection of contrast material through the existing
drainage catheter demonstrates no residual abscess cavity. Contrast
material refluxes along the tube. There is no fistulous connection
with the adjacent cecum.
COMPLICATIONS:
None

[Series 1: run · 4 of 4 slices shown]
[im 1/4]
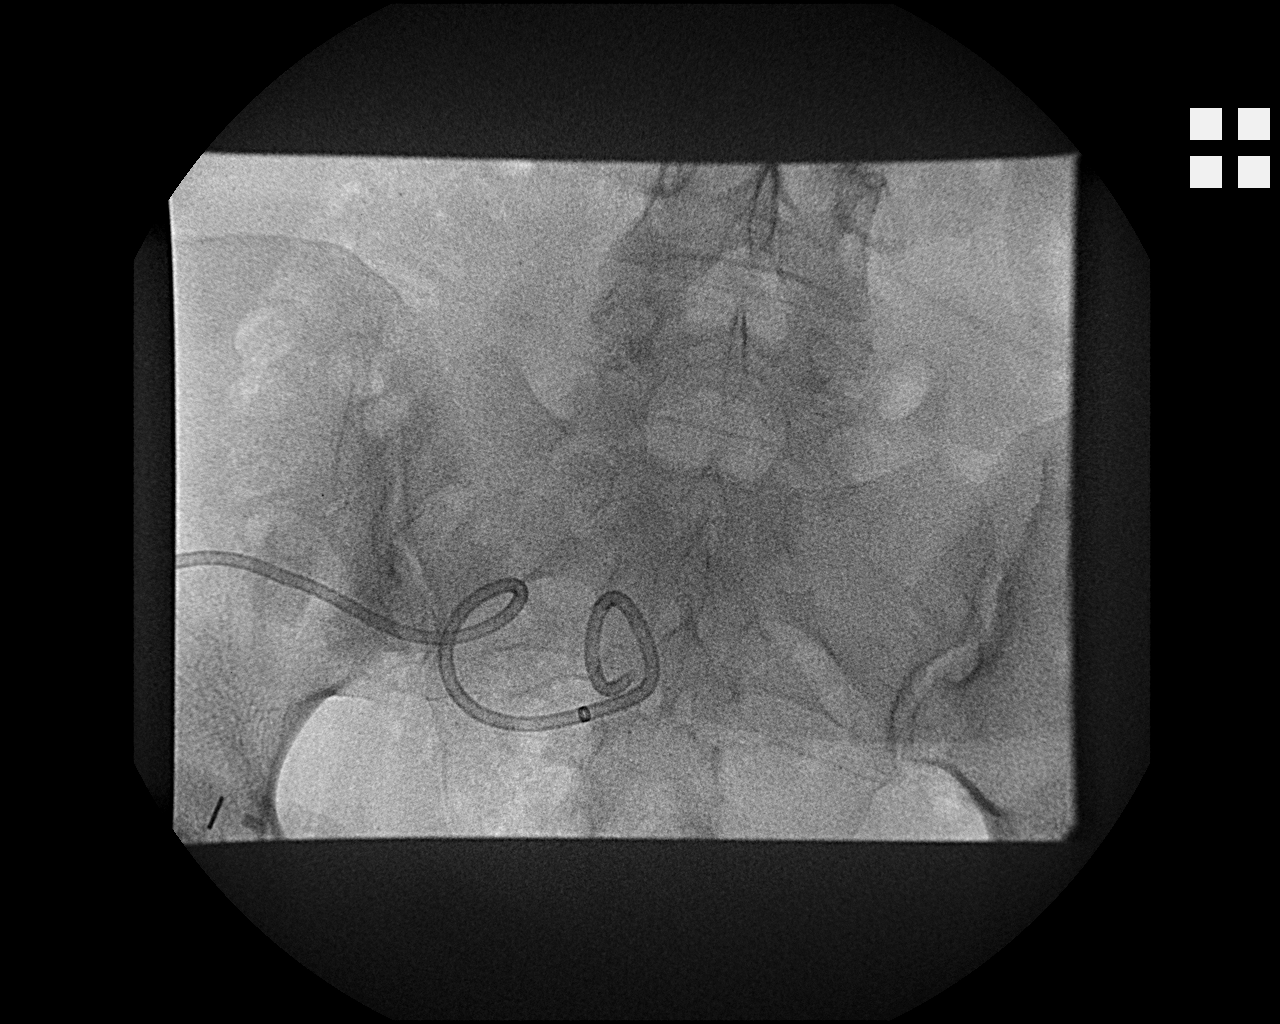
[im 2/4]
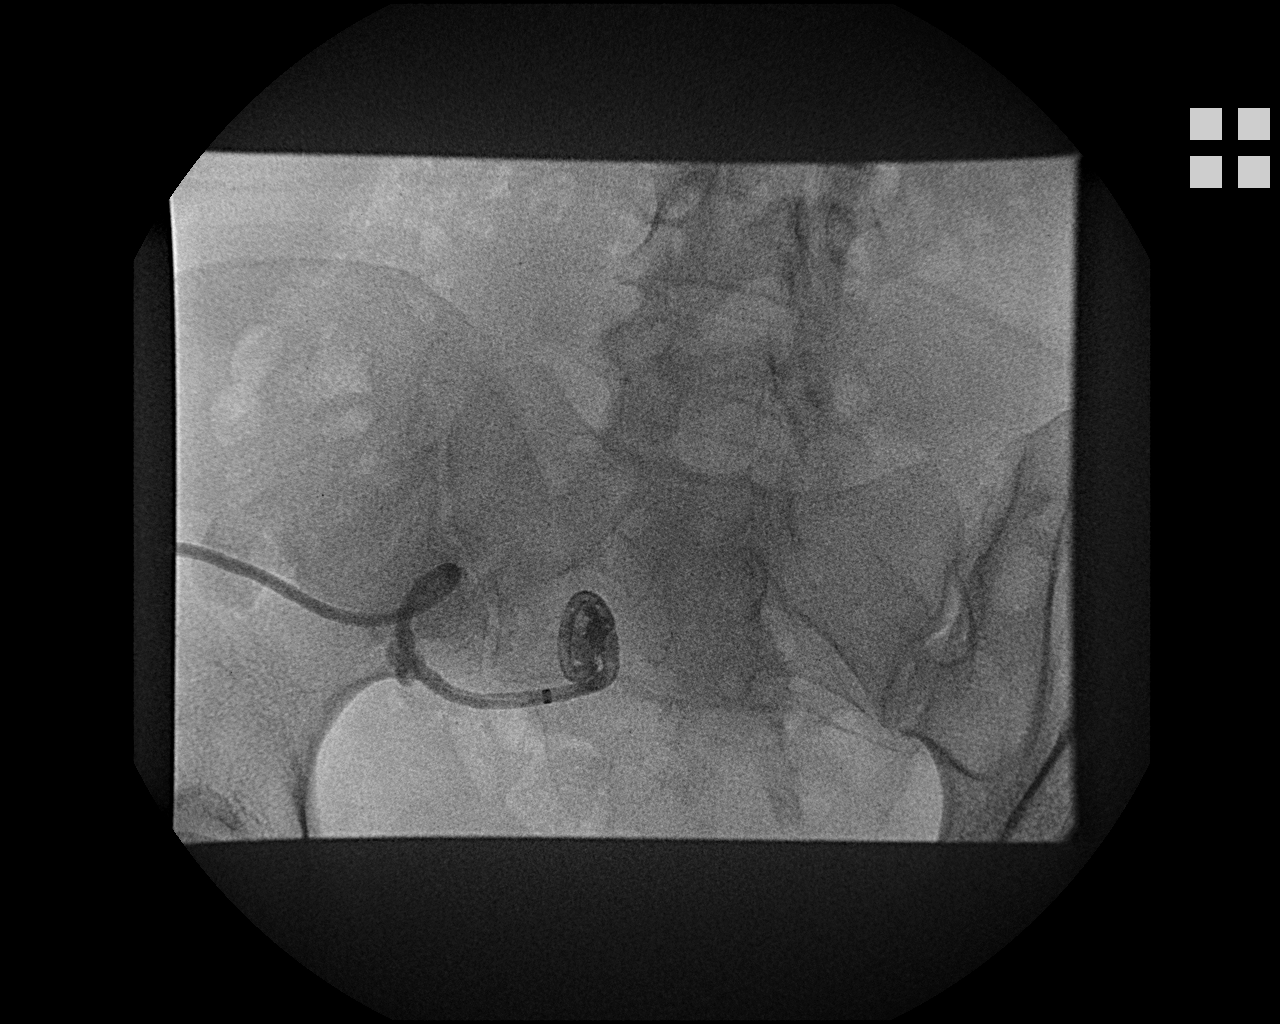
[im 3/4]
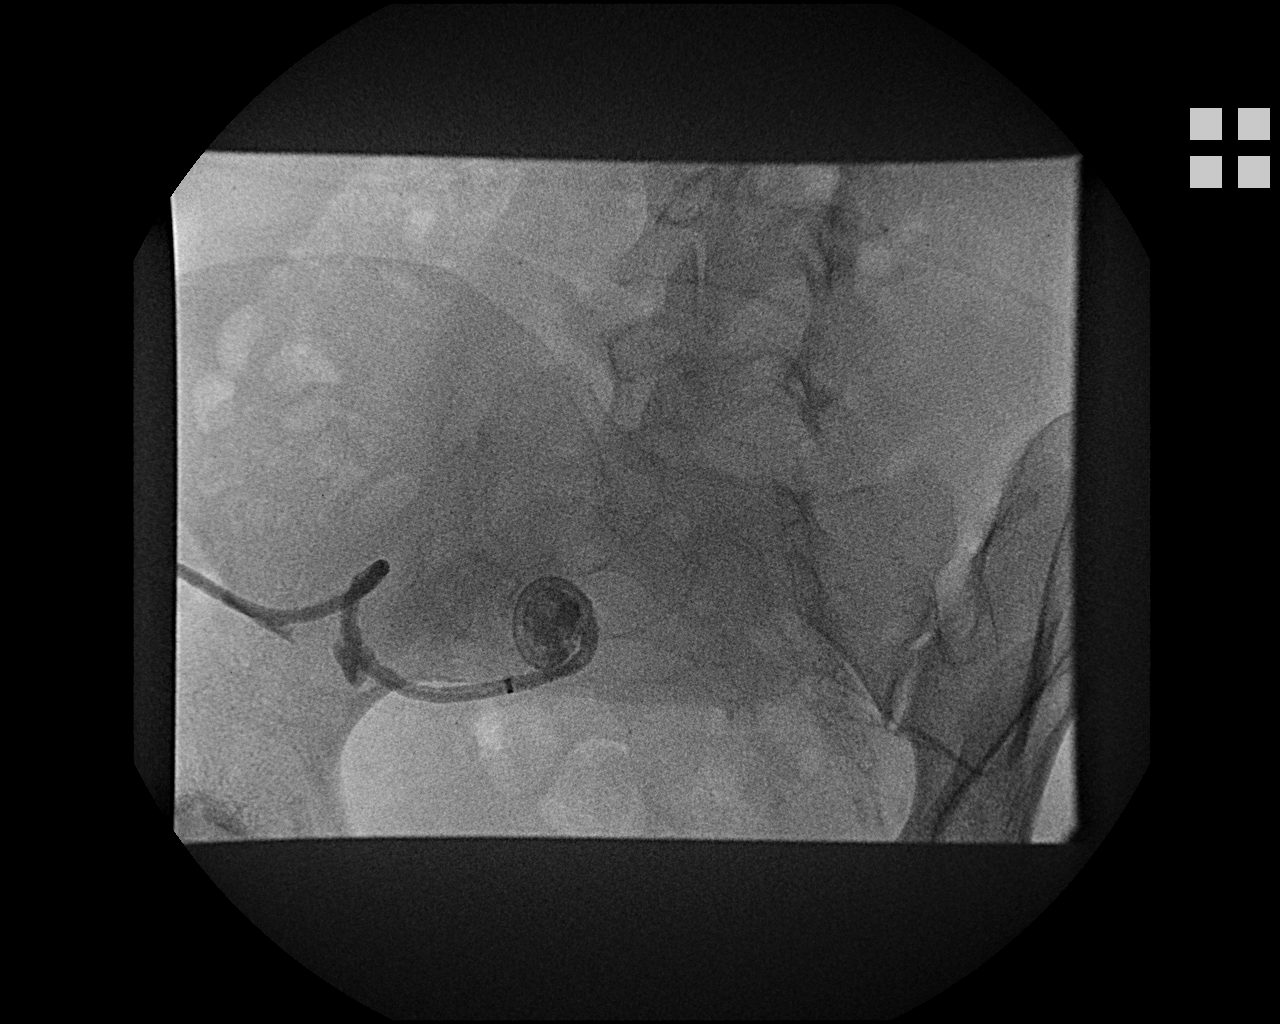
[im 4/4]
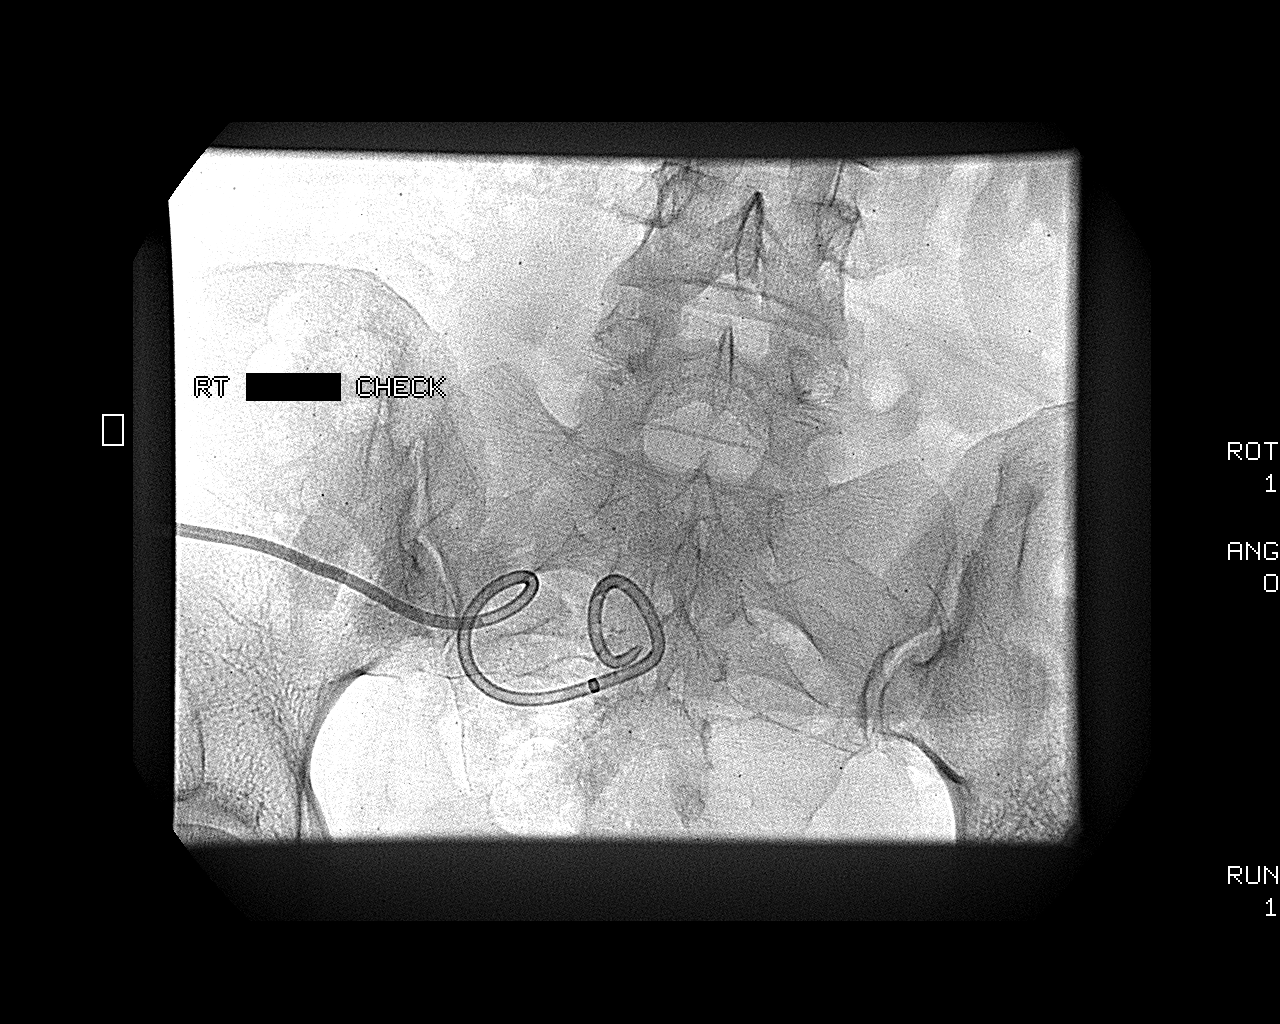

[4 of 4 positions shown; findings below may reference images not displayed]

Today, she reports minimal (less than 1 tsp) serous drainage daily.
She denies fever, chills, abdominal pain common nausea or vomiting.

EXAM:
SINUS TRACT INJECTION/FISTULOGRAM

Date: 07/27/2014

PROCEDURE:
1. Contrast injection through existing abscess drain

ANESTHESIA/SEDATION:
None required

MEDICATIONS:
None

FLUOROSCOPY TIME:  12 seconds

CONTRAST:  5mL OMNIPAQUE IOHEXOL 300 MG/ML  SOLN
IMPRESSION: 1. Interval resolution of fistulous connection with the adjacent
cecum.
The patient's clinical status and the findings of this injection
were discussed with Dr. Chi Chi Bergeron over the phone. We agree that
the existing percutaneous drainage catheter can be removed. The
drain was removed following this conversation. The patient will keep
their followup appointment with Dr. Funani to discuss interval
appendectomy.

## 2017-04-04 ENCOUNTER — Encounter: Payer: Self-pay | Admitting: Family Medicine

## 2017-05-29 ENCOUNTER — Other Ambulatory Visit: Payer: Self-pay | Admitting: Gastroenterology

## 2017-05-29 DIAGNOSIS — R1310 Dysphagia, unspecified: Secondary | ICD-10-CM

## 2017-06-03 ENCOUNTER — Ambulatory Visit
Admission: RE | Admit: 2017-06-03 | Discharge: 2017-06-03 | Disposition: A | Discharge status: Home / Self Care | Payer: Self-pay | Source: Ambulatory Visit | Attending: Gastroenterology | Admitting: Gastroenterology

## 2017-06-03 DIAGNOSIS — R1310 Dysphagia, unspecified: Secondary | ICD-10-CM

## 2019-03-25 ENCOUNTER — Other Ambulatory Visit (HOSPITAL_COMMUNITY)
Admission: RE | Admit: 2019-03-25 | Discharge: 2019-03-25 | Disposition: A | Discharge status: Home / Self Care | Payer: PRIVATE HEALTH INSURANCE | Source: Ambulatory Visit | Attending: Obstetrics and Gynecology | Admitting: Obstetrics and Gynecology

## 2019-03-25 ENCOUNTER — Other Ambulatory Visit: Payer: Self-pay | Admitting: Obstetrics and Gynecology

## 2019-03-25 DIAGNOSIS — Z124 Encounter for screening for malignant neoplasm of cervix: Secondary | ICD-10-CM | POA: Diagnosis present

## 2019-03-30 LAB — CYTOLOGY - PAP
Diagnosis: NEGATIVE
HPV: NOT DETECTED

## 2019-10-04 ENCOUNTER — Other Ambulatory Visit: Payer: Self-pay

## 2019-10-04 ENCOUNTER — Emergency Department (HOSPITAL_COMMUNITY)
Admission: EM | Admit: 2019-10-04 | Discharge: 2019-10-04 | Disposition: A | Discharge status: Home / Self Care | Payer: No Typology Code available for payment source | Attending: Emergency Medicine | Admitting: Emergency Medicine

## 2019-10-04 ENCOUNTER — Encounter (HOSPITAL_COMMUNITY): Payer: Self-pay | Admitting: Emergency Medicine

## 2019-10-04 ENCOUNTER — Emergency Department (HOSPITAL_COMMUNITY): Payer: No Typology Code available for payment source

## 2019-10-04 DIAGNOSIS — Z79899 Other long term (current) drug therapy: Secondary | ICD-10-CM | POA: Insufficient documentation

## 2019-10-04 DIAGNOSIS — M5431 Sciatica, right side: Secondary | ICD-10-CM | POA: Insufficient documentation

## 2019-10-04 DIAGNOSIS — J45909 Unspecified asthma, uncomplicated: Secondary | ICD-10-CM | POA: Insufficient documentation

## 2019-10-04 DIAGNOSIS — M25551 Pain in right hip: Secondary | ICD-10-CM | POA: Diagnosis present

## 2019-10-04 DIAGNOSIS — F17210 Nicotine dependence, cigarettes, uncomplicated: Secondary | ICD-10-CM | POA: Insufficient documentation

## 2019-10-04 LAB — POC URINE PREG, ED: Preg Test, Ur: NEGATIVE

## 2019-10-04 MED ORDER — LIDOCAINE 5 % EX PTCH: 1.0000 | MEDICATED_PATCH | CUTANEOUS | 0 refills | Status: AC

## 2019-10-04 MED ORDER — HYDROCODONE-ACETAMINOPHEN 5-325 MG PO TABS: 1.0000 | ORAL_TABLET | ORAL | 0 refills | Status: AC | PRN

## 2019-10-04 MED ORDER — KETOROLAC TROMETHAMINE 30 MG/ML IJ SOLN
30.0000 mg | Freq: Once | INTRAMUSCULAR | Status: AC
  Administered 2019-10-04: 30 mg via INTRAMUSCULAR
  Filled 2019-10-04: qty 1

## 2019-10-04 MED ORDER — HYDROCODONE-ACETAMINOPHEN 5-325 MG PO TABS
1.0000 | ORAL_TABLET | Freq: Once | ORAL | Status: DC
  Filled 2019-10-04: qty 1

## 2019-10-04 MED ORDER — PREDNISONE 20 MG PO TABS
60.0000 mg | ORAL_TABLET | Freq: Once | ORAL | Status: AC
  Administered 2019-10-04: 60 mg via ORAL
  Filled 2019-10-04: qty 3

## 2019-10-04 MED ORDER — PREDNISONE 50 MG PO TABS: 50.0000 mg | ORAL_TABLET | Freq: Every day | ORAL | 0 refills | Status: AC

## 2019-10-04 MED ORDER — KETOROLAC TROMETHAMINE 30 MG/ML IJ SOLN: 30.0000 mg | Freq: Once | INTRAMUSCULAR | Status: DC

## 2019-10-04 NOTE — ED Notes (Signed)
Patient transported to XR. 

## 2019-10-04 NOTE — ED Triage Notes (Signed)
Patient reports right hip pain without injury. Reports seen at PCP x2 for same and completed prednisone x2. States initially prednisone manages pain. States pain worsens with movement and radiates down right leg.

## 2019-10-04 NOTE — Discharge Instructions (Addendum)
Take the medications as prescribed  Return for new or worsening symptoms 

## 2019-10-04 NOTE — ED Provider Notes (Signed)
[facility] DEPT Provider Note   CSN: [id number] Arrival date & time: 10/04/19  1122    History Chief Complaint  Patient presents with  . Hip Pain    Kelli Lamb is a 49 y.o. female with no significant past medical history who presents for evaluation of hip pain.  Patient has had intermittent right hip pain x5 weeks.  Initially put on steroids by PCP which resolved her pain however when she stopped taking these her pain returned.  States pain starts at her right posterior buttocks and lateral right hip and radiates down into her leg.  Had some intermittent tingling sensation however none currently.  She denies any recent falls or injuries.  Has been taking Tylenol and Mobic without relief of her pain.  She denies any fever, chills, nausea, vomiting, chest pain, shortness of breath, abdominal pain, diarrhea, dysuria, IV drug use, bowel or bladder incontinence, saddle paresthesia, malignancy, increased nighttime pain.  Pain worse with movement and standing straight up.  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  Rates her current pain a 9/10.  Has not followed with orthopedics.  Denies additional aggravating or alleviating factors.  Has never had any imaging performed  History obtained from patient and past medical records.  No interpretor was used.  HPI     Past Medical History:  Diagnosis Date  . Anxiety     Patient Active Problem List   Diagnosis Date Noted  . Multiple pulmonary nodules 09/21/2014  . Acute appendicitis with perforation and peritoneal abscess 07/09/2014  . Cigarette smoker 07/09/2014  . Asthma, chronic 07/09/2014  . Insomnia 07/09/2014    Past Surgical History:  Procedure Laterality Date  . DILATATION & CURETTAGE/HYSTEROSCOPY WITH MYOSURE  11/03/2014   Procedure: DILATATION & CURETTAGE/HYSTEROSCOPY WITH MYOSURE;  Surgeon: Christophe Louis, MD;  Location: [facility];  Service: Gynecology;;  . Murrell Redden &  CURRETTAGE/HYSTROSCOPY WITH NOVASURE ABLATION N/A 11/03/2014   Procedure: DILATATION & CURETTAGE/HYSTEROSCOPY WITH NOVASURE ABLATION;  Surgeon: Christophe Louis, MD;  Location: [facility];  Service: Gynecology;  Laterality: N/A;  Also Myosure/Removal Of Endometrial Mass  . TONSILLECTOMY    . TUBAL LIGATION       OB History   No obstetric history on file.     No family history on file.  Social History   Tobacco Use  . Smoking status: Current Every Day Smoker    Packs/day: 0.50    Years: 25.00    Pack years: 12.50    Types: Cigarettes  . Smokeless tobacco: Never Used  Substance Use Topics  . Alcohol use: No    Alcohol/week: 0.0 standard drinks  . Drug use: No    Home Medications Prior to Admission medications   Medication Sig Start Date End Date Taking? Authorizing Provider  albuterol (PROVENTIL HFA;VENTOLIN HFA) 108 (90 BASE) MCG/ACT inhaler Inhale 1 puff into the lungs every 4 (four) hours as needed for wheezing or shortness of breath.    [provider]  budesonide-formoterol (SYMBICORT) 160-4.5 MCG/ACT inhaler Inhale 1 puff into the lungs 2 (two) times daily.    [provider]  citalopram (CELEXA) 10 MG tablet Take 10 mg by mouth daily. 08/30/14   [provider]  diphenhydrAMINE (BENADRYL) 25 MG tablet Take 25 mg by mouth every 6 (six) hours as needed for allergies.    [provider]  doxycycline (VIBRAMYCIN) 50 MG capsule 2 po daily 11/03/14   Christophe Louis, MD  HYDROcodone-acetaminophen (NORCO/VICODIN) 5-325 MG  tablet Take 1 tablet by mouth every 4 (four) hours as needed. 10/04/19   Ottilia Pippenger A, PA-C  ibuprofen (ADVIL,MOTRIN) 800 MG tablet Take on tablet by mouth every 8 hours for 24 hours then prn with food #30 no refill 11/03/14   Christophe Louis, MD  lidocaine (LIDODERM) 5 % Place 1 patch onto the skin daily. Remove & Discard patch within 12 hours or as directed by MD 10/04/19   Raylan Troiani A, PA-C  predniSONE (DELTASONE) 50 MG tablet Take 1  tablet (50 mg total) by mouth daily for 5 days. 10/04/19 10/09/19  Crytal Pensinger A, PA-C  zolpidem (AMBIEN) 10 MG tablet Take 10 mg by mouth at bedtime.  06/23/14   [provider]    Allergies    Wellbutrin [bupropion]  Review of Systems   Review of Systems  Constitutional: Negative.   HENT: Negative.   Respiratory: Negative.   Cardiovascular: Negative.   Gastrointestinal: Negative.   Genitourinary: Negative.   Musculoskeletal: Positive for back pain. Negative for arthralgias, gait problem, joint swelling, myalgias, neck pain and neck stiffness.       Right hip pain  Skin: Negative.   Neurological: Negative.   All other systems reviewed and are negative.   Physical Exam Updated Vital Signs BP (!) 154/86 (BP Location: Left Arm)   Pulse 89   Temp 98.3 F (36.8 C) (Oral)   Resp 18   SpO2 96%   Physical Exam Physical Exam  Constitutional: Pt appears well-developed and well-nourished. No distress.  HENT:  Head: Normocephalic and atraumatic.  Mouth/Throat: Oropharynx is clear and moist. No oropharyngeal exudate.  Eyes: Conjunctivae are normal.  Neck: Normal range of motion. Neck supple.  Full ROM without pain  Cardiovascular: Normal rate, regular rhythm and intact distal pulses.   Pulmonary/Chest: Effort normal and breath sounds normal. No respiratory distress. Pt has no wheezes.  Abdominal: Soft. Pt exhibits no distension. There is no tenderness, rebound or guarding. No abd bruit or pulsatile mass Musculoskeletal:  Full range of motion of the T-spine and L-spine with flexion, hyperextension, and lateral flexion. No midline tenderness or stepoffs. No tenderness to palpation of the spinous processes of the T-spine or L-spine. Mild tenderness to palpation of the paraspinous muscles of the L-spine on RIGHT and right piriformis. Positive straight leg raise on right at 40'. Lymphadenopathy:    Pt has no cervical adenopathy.  Neurological: Pt is alert. Pt has normal  reflexes.  Reflex Scores:      Bicep reflexes are 2+ on the right side and 2+ on the left side.      Brachioradialis reflexes are 2+ on the right side and 2+ on the left side.      Patellar reflexes are 2+ on the right side and 2+ on the left side.      Achilles reflexes are 2+ on the right side and 2+ on the left side. Speech is clear and goal oriented, follows commands Normal 5/5 strength in upper and lower extremities bilaterally including dorsiflexion and plantar flexion, strong and equal grip strength Sensation normal to light and sharp touch Moves extremities without ataxia, coordination intact Normal gait Normal balance No Clonus Skin: Skin is warm and dry. No rash noted or lesions noted. Pt is not diaphoretic. No erythema, ecchymosis,edema or warmth.  Psychiatric: Pt has a normal mood and affect. Behavior is normal.  Nursing note and vitals reviewed. ED Results / Procedures / Treatments   Labs (all labs ordered are listed, but only  abnormal results are displayed) Labs Reviewed  POC URINE PREG, ED    EKG None  Radiology DG Lumbar Spine Complete  Result Date: 10/04/2019 CLINICAL DATA:  Right lower extremity pain. EXAM: LUMBAR SPINE - COMPLETE 4+ VIEW COMPARISON:  None. FINDINGS: Minimal grade 1 anterolisthesis of L4-5 is noted, most likely due to posterior facet joint hypertrophy. No fracture is noted. Disc spaces are well-maintained. IMPRESSION: Minimal grade 1 anterolisthesis of L4-5. No acute abnormality seen in the lumbar spine. Electronically Signed   By: Marijo Conception M.D.   On: 10/04/2019 12:44   DG Hip Unilat W or Wo Pelvis 2-3 Views Right  Result Date: 10/04/2019 CLINICAL DATA:  Right hip pain. EXAM: DG HIP (WITH OR WITHOUT PELVIS) 2-3V RIGHT COMPARISON:  None. FINDINGS: There is no evidence of hip fracture or dislocation. There is no evidence of arthropathy or other focal bone abnormality. IMPRESSION: Negative. Electronically Signed   By: Marijo Conception M.D.   On:  10/04/2019 12:45    Procedures Procedures (including critical care time)  Medications Ordered in ED Medications  HYDROcodone-acetaminophen (NORCO/VICODIN) 5-325 MG per tablet 1 tablet (1 tablet Oral Refused 10/04/19 1205)  predniSONE (DELTASONE) tablet 60 mg (60 mg Oral Given 10/04/19 1205)  ketorolac (TORADOL) 30 MG/ML injection 30 mg (30 mg Intramuscular Given 10/04/19 1241)   ED Course  I have reviewed the triage vital signs and the nursing notes.  Pertinent labs & imaging results that were available during my care of the patient were reviewed by me and considered in my medical decision making (see chart for details).  49 year old presents for evaluation of right hip pain x month. Afebrile, non septic, non ill appearing. No recent falls or injuries. Pain to right posterior hip, piriformis and radiate into right latera leg. NV intact. Normal MSK exam however with pain. No overlying skin changes, no edema, erythema or warmth.  No obvious effusion.  Mild tenderness to right lumbar paraspinal muscles however no midline back pain.  No red flags for back pain.  Low suspicion for acute neurosurgical emergency or infectious process.  Imaging personally reviewed and interpreted: Preg negative Dg right hip negative DG lumbar with minimal anterolisthesis L4-L5  Pain improved here in ED. Ambulatory without difficulty.  No concern for cauda equina.  No fever, night sweats, weight loss, h/o cancer, IVDU.  RICE protocol and pain medicine indicated and discussed with patient.    Highly suspect radicular pain as cause of her pain. Low suspicion for septic joint, gout, hemarthrosis, DVT, compartment syndrome, myositis.  The patient has been appropriately medically screened and/or stabilized in the ED. I have low suspicion for any other emergent medical condition which would require further screening, evaluation or treatment in the ED or require inpatient management.     MDM Rules/Calculators/A&P                        Final Clinical Impression(s) / ED Diagnoses Final diagnoses:  Sciatica of right side    Rx / DC Orders ED Discharge Orders         Ordered    HYDROcodone-acetaminophen (NORCO/VICODIN) 5-325 MG tablet  Every 4 hours PRN     10/04/19 1258    predniSONE (DELTASONE) 50 MG tablet  Daily     10/04/19 1258    lidocaine (LIDODERM) 5 %  Every 24 hours     10/04/19 1258           Emmanuell Kantz  A, PA-C 10/04/19 1259    Lacretia Leigh, MD 10/04/19 1346

## 2019-10-04 NOTE — ED Notes (Signed)
ED Provider at bedside. 

## 2021-07-02 ENCOUNTER — Emergency Department (HOSPITAL_COMMUNITY)
Admission: EM | Admit: 2021-07-02 | Discharge: 2021-07-02 | Disposition: A | Discharge status: Home / Self Care | Payer: PRIVATE HEALTH INSURANCE | Attending: Emergency Medicine | Admitting: Emergency Medicine

## 2021-07-02 ENCOUNTER — Other Ambulatory Visit: Payer: Self-pay

## 2021-07-02 ENCOUNTER — Encounter (HOSPITAL_COMMUNITY): Payer: Self-pay | Admitting: Oncology

## 2021-07-02 ENCOUNTER — Emergency Department (HOSPITAL_COMMUNITY): Payer: PRIVATE HEALTH INSURANCE

## 2021-07-02 DIAGNOSIS — J45909 Unspecified asthma, uncomplicated: Secondary | ICD-10-CM | POA: Insufficient documentation

## 2021-07-02 DIAGNOSIS — R059 Cough, unspecified: Secondary | ICD-10-CM | POA: Diagnosis present

## 2021-07-02 DIAGNOSIS — Z20822 Contact with and (suspected) exposure to covid-19: Secondary | ICD-10-CM | POA: Insufficient documentation

## 2021-07-02 DIAGNOSIS — F17210 Nicotine dependence, cigarettes, uncomplicated: Secondary | ICD-10-CM | POA: Insufficient documentation

## 2021-07-02 DIAGNOSIS — J189 Pneumonia, unspecified organism: Secondary | ICD-10-CM | POA: Insufficient documentation

## 2021-07-02 LAB — RESP PANEL BY RT-PCR (FLU A&B, COVID) ARPGX2
Influenza A by PCR: NEGATIVE
Influenza B by PCR: NEGATIVE
SARS Coronavirus 2 by RT PCR: NEGATIVE

## 2021-07-02 MED ORDER — DOXYCYCLINE HYCLATE 100 MG PO TABS
100.0000 mg | ORAL_TABLET | Freq: Once | ORAL | Status: AC
  Administered 2021-07-02: 20:00:00 100 mg via ORAL
  Filled 2021-07-02: qty 1

## 2021-07-02 MED ORDER — PREDNISONE 20 MG PO TABS
60.0000 mg | ORAL_TABLET | Freq: Once | ORAL | Status: AC
  Administered 2021-07-02: 20:00:00 60 mg via ORAL
  Filled 2021-07-02: qty 3

## 2021-07-02 MED ORDER — DOXYCYCLINE HYCLATE 100 MG PO CAPS: 100.0000 mg | ORAL_CAPSULE | Freq: Two times a day (BID) | ORAL | 0 refills | Status: AC

## 2021-07-02 MED ORDER — PREDNISONE 50 MG PO TABS: 50.0000 mg | ORAL_TABLET | Freq: Every day | ORAL | 0 refills | Status: AC

## 2021-07-02 MED ORDER — IPRATROPIUM-ALBUTEROL 0.5-2.5 (3) MG/3ML IN SOLN
3.0000 mL | Freq: Once | RESPIRATORY_TRACT | Status: AC
  Administered 2021-07-02: 20:00:00 3 mL via RESPIRATORY_TRACT
  Filled 2021-07-02: qty 3

## 2021-07-02 NOTE — ED Provider Notes (Signed)
[facility] DEPT Provider Note   CSN: [id number] Arrival date & time: 07/02/21  1456     History Chief Complaint  Patient presents with   Cough    Kelli Lamb is a 50 y.o. female.  HPI   50 y/o female with a h/o anxiety, pulm nodules, tobacco use, who presents to the ED today c/o cough, sob, wheezing, congestion, fevers, generalized weakness that started 5 days ago. She was recently around her grandchildren with RSV. Reports she has a h/o wheezing and her inhalers have not been helping her symptoms recently. Sxs are constant and worsening in nature.   Past Medical History:  Diagnosis Date   Anxiety     Patient Active Problem List   Diagnosis Date Noted   Multiple pulmonary nodules 09/21/2014   Acute appendicitis with perforation and peritoneal abscess 07/09/2014   Cigarette smoker 07/09/2014   Asthma, chronic 07/09/2014   Insomnia 07/09/2014    Past Surgical History:  Procedure Laterality Date   DILATATION & CURETTAGE/HYSTEROSCOPY WITH MYOSURE  11/03/2014   Procedure: DILATATION & CURETTAGE/HYSTEROSCOPY WITH MYOSURE;  Surgeon: Christophe Louis, MD;  Location: [facility];  Service: Gynecology;;   [facility] N/A 11/03/2014   Procedure: DILATATION & CURETTAGE/HYSTEROSCOPY WITH NOVASURE ABLATION;  Surgeon: Christophe Louis, MD;  Location: [facility];  Service: Gynecology;  Laterality: N/A;  Also Myosure/Removal Of Endometrial Mass   TONSILLECTOMY     TUBAL LIGATION       OB History   No obstetric history on file.     No family history on file.  Social History   Tobacco Use   Smoking status: Every Day    Packs/day: 0.50    Years: 25.00    Pack years: 12.50    Types: Cigarettes   Smokeless tobacco: Never  Substance Use Topics   Alcohol use: No    Alcohol/week: 0.0 standard drinks   Drug use: No    Home Medications Prior to Admission medications   Medication Sig Start Date End Date Taking? Authorizing  Provider  doxycycline (VIBRAMYCIN) 100 MG capsule Take 1 capsule (100 mg total) by mouth 2 (two) times daily for 7 days. 07/02/21 07/09/21 Yes Aloysuis Ribaudo S, PA-C  predniSONE (DELTASONE) 50 MG tablet Take 1 tablet (50 mg total) by mouth daily for 5 days. 07/02/21 07/07/21 Yes Madix Blowe S, PA-C  albuterol (PROVENTIL HFA;VENTOLIN HFA) 108 (90 BASE) MCG/ACT inhaler Inhale 1 puff into the lungs every 4 (four) hours as needed for wheezing or shortness of breath.    [provider]  budesonide-formoterol (SYMBICORT) 160-4.5 MCG/ACT inhaler Inhale 1 puff into the lungs 2 (two) times daily.    [provider]  citalopram (CELEXA) 10 MG tablet Take 10 mg by mouth daily. 08/30/14   [provider]  diphenhydrAMINE (BENADRYL) 25 MG tablet Take 25 mg by mouth every 6 (six) hours as needed for allergies.    [provider]  doxycycline (VIBRAMYCIN) 50 MG capsule 2 po daily 11/03/14   Christophe Louis, MD  HYDROcodone-acetaminophen (NORCO/VICODIN) 5-325 MG tablet Take 1 tablet by mouth every 4 (four) hours as needed. 10/04/19   Henderly, Britni A, PA-C  ibuprofen (ADVIL,MOTRIN) 800 MG tablet Take on tablet by mouth every 8 hours for 24 hours then prn with food #30 no refill 11/03/14   Christophe Louis, MD  lidocaine (LIDODERM) 5 % Place 1 patch onto the skin daily. Remove & Discard patch within 12 hours or as directed by MD 10/04/19  Henderly, Britni A, PA-C  zolpidem (AMBIEN) 10 MG tablet Take 10 mg by mouth at bedtime.  06/23/14   [provider]    Allergies    Wellbutrin [bupropion]  Review of Systems   Review of Systems  Constitutional:  Negative for fever.  HENT:  Negative for ear pain and sore throat.   Eyes:  Negative for pain and visual disturbance.  Respiratory:  Positive for cough, chest tightness, shortness of breath and wheezing.   Cardiovascular:  Negative for chest pain.  Gastrointestinal:  Negative for abdominal pain, constipation, diarrhea, nausea and  vomiting.  Genitourinary:  Negative for dysuria and hematuria.  Musculoskeletal:  Negative for back pain.  Skin:  Negative for rash.  Neurological:  Positive for weakness.  All other systems reviewed and are negative.  Physical Exam Updated Vital Signs BP 138/76 (BP Location: Left Arm)    Pulse 80    Temp 98.4 F (36.9 C) (Oral)    Resp 18    Ht 5\' 4"  (1.626 m)    Wt 81.6 kg    SpO2 98%    BMI 30.90 kg/m   Physical Exam Vitals and nursing note reviewed.  Constitutional:      General: She is not in acute distress.    Appearance: She is well-developed.  HENT:     Head: Normocephalic and atraumatic.  Eyes:     Conjunctiva/sclera: Conjunctivae normal.  Cardiovascular:     Rate and Rhythm: Normal rate and regular rhythm.     Heart sounds: No murmur heard. Pulmonary:     Effort: Pulmonary effort is normal. No respiratory distress.     Breath sounds: Wheezing present.     Comments: No tachypnea Abdominal:     General: Bowel sounds are normal.     Palpations: Abdomen is soft.     Tenderness: There is no abdominal tenderness.  Musculoskeletal:        General: No swelling.     Cervical back: Neck supple.  Skin:    General: Skin is warm and dry.     Capillary Refill: Capillary refill takes less than 2 seconds.  Neurological:     Mental Status: She is alert.  Psychiatric:        Mood and Affect: Mood normal.    ED Results / Procedures / Treatments   Labs (all labs ordered are listed, but only abnormal results are displayed) Labs Reviewed  RESP PANEL BY RT-PCR (FLU A&B, COVID) [id number]    EKG None  Radiology DG Chest 2 View  Result Date: 07/02/2021 CLINICAL DATA:  Fever, chills, body aches and dry cough for 5 days. History of asthma and current smoker. EXAM: CHEST - 2 VIEW COMPARISON:  Chest radiograph 10/21/2014 FINDINGS: The heart size and mediastinal contours are within normal limits. There are infiltrates in the bilateral mid to lower lungs concerning for infection. No  pneumothorax or pleural effusion. No acute finding in the visualized skeleton. IMPRESSION: Bilateral mid to lower lung infiltrates concerning for infection. Recommend follow-up radiograph in 3-4 weeks to ensure resolution. Electronically Signed   By: Audie Pinto M.D.   On: 07/02/2021 15:35    Procedures Procedures   Medications Ordered in ED Medications  ipratropium-albuterol (DUONEB) 0.5-2.5 (3) MG/3ML nebulizer solution 3 mL (3 mLs Nebulization Given 07/02/21 2025)  predniSONE (DELTASONE) tablet 60 mg (60 mg Oral Given 07/02/21 2024)  doxycycline (VIBRA-TABS) tablet 100 mg (100 mg Oral Given 07/02/21 2024)    ED Course  I have reviewed the  triage vital signs and the nursing notes.  Pertinent labs & imaging results that were available during my care of the patient were reviewed by me and considered in my medical decision making (see chart for details).    MDM Rules/Calculators/A&P                          50 y/o female presents for eval of cough, sob, wheezing that started 5 days ago. Hx tobacco use and recent rsv exposure. Covid/flu neg. Discussed viral panel, pt okay to hold off on this test at this time. Cxr reviewed/interpreted - Bilateral mid to lower lung infiltrates concerning for infection. She was given a duoneb, steroids and abx in the ED. On reassessment she feels some improvement. She is not hypoxic and does not appear to require admission at this time. Feel she is appropriate for d/c home with close f/u and strict return precautions. She understands she needs to have her cxr repeated in 3-4 weeks. All questions answered, pt stable for discharge.     Final Clinical Impression(s) / ED Diagnoses Final diagnoses:  Community acquired pneumonia, unspecified laterality    Rx / DC Orders ED Discharge Orders          Ordered    predniSONE (DELTASONE) 50 MG tablet  Daily        07/02/21 2037    doxycycline (VIBRAMYCIN) 100 MG capsule  2 times daily        07/02/21 2037              Bishop Dublin 07/02/21 2100    Carmin Muskrat, MD 07/02/21 2158

## 2021-07-02 NOTE — ED Provider Notes (Signed)
Emergency Medicine Provider Triage Evaluation Note  Kelli Lamb , a 50 y.o. female  was evaluated in triage.  Pt complains of cough, sob, wheezing, congestion, fevers, generalized weakness that started 5 days ago. Was recently around her grandchildren with rsv  Review of Systems  Positive: cough, sob, wheezing, congestion, fevers, generalized weakness Negative: vomiting  Physical Exam  BP 118/80 (BP Location: Right Arm)    Pulse 95    Temp 98.4 F (36.9 C) (Oral)    Resp 19    SpO2 93%  Gen:   Awake, no distress   Resp:  Normal effort  MSK:   Moves extremities without difficulty   Medical Decision Making  Medically screening exam initiated at 3:15 PM.  Appropriate orders placed.  Kelli Lamb was informed that the remainder of the evaluation will be completed by another provider, this initial triage assessment does not replace that evaluation, and the importance of remaining in the ED until their evaluation is complete.     Bishop Dublin 07/02/21 1515    Carmin Muskrat, MD 07/02/21 2158

## 2021-07-02 NOTE — ED Triage Notes (Signed)
Pt presents d/t cough, fever, chills, generalized body aches, weakness.  Pt's granddaughter dx w/ RSV last week.  Denies pain currently.

## 2021-07-02 NOTE — Discharge Instructions (Signed)
Take prednisone and antibiotics as directed   Please follow up with your primary care provider within 5-7 days for re-evaluation of your symptoms. If you do not have a primary care provider, information for a healthcare clinic has been provided for you to make arrangements for follow up care. Please return to the emergency department for any new or worsening symptoms.

## 2021-08-01 ENCOUNTER — Encounter (HOSPITAL_COMMUNITY): Payer: Self-pay | Admitting: Radiology

## 2022-08-02 ENCOUNTER — Emergency Department (HOSPITAL_COMMUNITY)
Admission: EM | Admit: 2022-08-02 | Discharge: 2022-08-03 | Disposition: A | Discharge status: Home / Self Care | Payer: Commercial Managed Care - HMO | Attending: Emergency Medicine | Admitting: Emergency Medicine

## 2022-08-02 ENCOUNTER — Emergency Department (HOSPITAL_COMMUNITY): Payer: Commercial Managed Care - HMO

## 2022-08-02 DIAGNOSIS — R0600 Dyspnea, unspecified: Secondary | ICD-10-CM | POA: Diagnosis not present

## 2022-08-02 DIAGNOSIS — R079 Chest pain, unspecified: Secondary | ICD-10-CM | POA: Insufficient documentation

## 2022-08-02 DIAGNOSIS — J45909 Unspecified asthma, uncomplicated: Secondary | ICD-10-CM | POA: Insufficient documentation

## 2022-08-02 DIAGNOSIS — Z7951 Long term (current) use of inhaled steroids: Secondary | ICD-10-CM | POA: Diagnosis not present

## 2022-08-02 DIAGNOSIS — F17210 Nicotine dependence, cigarettes, uncomplicated: Secondary | ICD-10-CM | POA: Diagnosis not present

## 2022-08-02 DIAGNOSIS — R0602 Shortness of breath: Secondary | ICD-10-CM | POA: Diagnosis present

## 2022-08-02 DIAGNOSIS — Z720 Tobacco use: Secondary | ICD-10-CM

## 2022-08-02 LAB — CBC
HCT: 43.5 % (ref 36.0–46.0)
Hemoglobin: 13.6 g/dL (ref 12.0–15.0)
MCH: 28.6 pg (ref 26.0–34.0)
MCHC: 31.3 g/dL (ref 30.0–36.0)
MCV: 91.6 fL (ref 80.0–100.0)
Platelets: 267 10*3/uL (ref 150–400)
RBC: 4.75 MIL/uL (ref 3.87–5.11)
RDW: 14 % (ref 11.5–15.5)
WBC: 7.2 10*3/uL (ref 4.0–10.5)
nRBC: 0 % (ref 0.0–0.2)

## 2022-08-02 LAB — I-STAT BETA HCG BLOOD, ED (MC, WL, AP ONLY): I-stat hCG, quantitative: <5 m[IU]/mL (ref <5)

## 2022-08-02 LAB — TROPONIN I (HIGH SENSITIVITY)
Troponin I (High Sensitivity): <2 ng/L (ref <18)
Troponin I (High Sensitivity): <2 ng/L (ref <18)

## 2022-08-02 LAB — BASIC METABOLIC PANEL
Anion gap: 6 (ref 5–15)
BUN: 16 mg/dL (ref 6–20)
CO2: 30 mmol/L (ref 22–32)
Calcium: 8.9 mg/dL (ref 8.9–10.3)
Chloride: 101 mmol/L (ref 98–111)
Creatinine, Ser: 0.78 mg/dL (ref 0.44–1.00)
GFR, Estimated: >60 mL/min (ref >60)
Glucose, Bld: 102 mg/dL — ABNORMAL HIGH (ref 70–99)
Potassium: 4.6 mmol/L (ref 3.5–5.1)
Sodium: 137 mmol/L (ref 135–145)

## 2022-08-02 LAB — D-DIMER, QUANTITATIVE: D-Dimer, Quant: 0.3 ug/mL-FEU (ref 0.00–0.50)

## 2022-08-02 MED ORDER — METHYLPREDNISOLONE SODIUM SUCC 125 MG IJ SOLR
62.5000 mg | Freq: Once | INTRAMUSCULAR | Status: AC
  Administered 2022-08-02: 62.5 mg via INTRAVENOUS
  Filled 2022-08-02: qty 2

## 2022-08-02 MED ORDER — IPRATROPIUM-ALBUTEROL 0.5-2.5 (3) MG/3ML IN SOLN
3.0000 mL | Freq: Once | RESPIRATORY_TRACT | Status: AC
  Administered 2022-08-02: 3 mL via RESPIRATORY_TRACT
  Filled 2022-08-02: qty 3

## 2022-08-02 NOTE — ED Triage Notes (Signed)
Pt states that she woke up Tuesday morning feeling SOB and having chest pressure "like a band is around my chest". Pt denies cardiac hx, COPD, CHF per pt.

## 2022-08-02 NOTE — ED Provider Notes (Signed)
[place] DEPT Provider Note  CSN: [id number] Arrival date & time: 08/02/22 1749  Chief Complaint(s) Shortness of Breath  HPI Kelli Lamb is a 52 y.o. female with past medical history as below, significant for abuse, asthma, insomnia, pulmonary nodules who presents to the ED with complaint of apnea, chest tightness, fatigue.  Patient reports difficulty breathing over the past week, worse than her baseline, she has chronic cough that is not acutely worsened.  Intermittent intermittently productive with clear/white sputum.  She smokes cigarettes daily.  No formal diagnosis of COPD does have history of asthma.  Used her mother's nebulizer machine at home which did improve her respiratory symptoms.  No unexpected weight gain over the past few weeks, no significant lower extremity edema worsened from baseline.  No fevers or chills.  No chest pain.  She is having pain between her shoulder blades previously over the past week but no significant pain to the anterior portion of her chest.  No recent travel or sick contacts.  No history of PE or DVT.  No history of OSA  Past Medical History Past Medical History:  Diagnosis Date   Anxiety    Patient Active Problem List   Diagnosis Date Noted   Multiple pulmonary nodules 09/21/2014   Acute appendicitis with perforation and peritoneal abscess 07/09/2014   Cigarette smoker 07/09/2014   Asthma, chronic 07/09/2014   Insomnia 07/09/2014   Home Medication(s) Prior to Admission medications   Medication Sig Start Date End Date Taking? Authorizing Provider  albuterol (VENTOLIN HFA) 108 (90 Base) MCG/ACT inhaler Inhale 1-2 puffs into the lungs every 4 (four) hours as needed for wheezing or shortness of breath. 08/03/22  Yes Wynona Dove A, DO  doxycycline (VIBRAMYCIN) 100 MG capsule Take 1 capsule (100 mg total) by mouth 2 (two) times daily for 7 days. 08/04/22 08/11/22 Yes Jeanell Sparrow, DO  predniSONE (DELTASONE) 20 MG tablet  Take 2 tablets (40 mg total) by mouth daily for 5 days. 08/03/22 08/08/22 Yes Wynona Dove A, DO  albuterol (PROVENTIL HFA;VENTOLIN HFA) 108 (90 BASE) MCG/ACT inhaler Inhale 1 puff into the lungs every 4 (four) hours as needed for wheezing or shortness of breath.    [provider]  budesonide-formoterol (SYMBICORT) 160-4.5 MCG/ACT inhaler Inhale 1 puff into the lungs 2 (two) times daily.    [provider]  citalopram (CELEXA) 10 MG tablet Take 10 mg by mouth daily. 08/30/14   [provider]  diphenhydrAMINE (BENADRYL) 25 MG tablet Take 25 mg by mouth every 6 (six) hours as needed for allergies.    [provider]  HYDROcodone-acetaminophen (NORCO/VICODIN) 5-325 MG tablet Take 1 tablet by mouth every 4 (four) hours as needed. 10/04/19   Henderly, Britni A, PA-C  ibuprofen (ADVIL,MOTRIN) 800 MG tablet Take on tablet by mouth every 8 hours for 24 hours then prn with food #30 no refill 11/03/14   Christophe Louis, MD  lidocaine (LIDODERM) 5 % Place 1 patch onto the skin daily. Remove & Discard patch within 12 hours or as directed by MD 10/04/19   Henderly, Britni A, PA-C  zolpidem (AMBIEN) 10 MG tablet Take 10 mg by mouth at bedtime.  06/23/14   [provider]  Past Surgical History Past Surgical History:  Procedure Laterality Date   DILATATION & CURETTAGE/HYSTEROSCOPY WITH MYOSURE  11/03/2014   Procedure: DILATATION & CURETTAGE/HYSTEROSCOPY WITH MYOSURE;  Surgeon: Christophe Louis, MD;  Location: [facility];  Service: Gynecology;;   [place] N/A 11/03/2014   Procedure: DILATATION & CURETTAGE/HYSTEROSCOPY WITH NOVASURE ABLATION;  Surgeon: Christophe Louis, MD;  Location: [facility];  Service: Gynecology;  Laterality: N/A;  Also Myosure/Removal Of Endometrial Mass   TONSILLECTOMY     TUBAL LIGATION     Family  History No family history on file.  Social History Social History   Tobacco Use   Smoking status: Every Day    Packs/day: 0.50    Years: 25.00    Total pack years: 12.50    Types: Cigarettes   Smokeless tobacco: Never  Substance Use Topics   Alcohol use: No    Alcohol/week: 0.0 standard drinks of alcohol   Drug use: No   Allergies Wellbutrin [bupropion]  Review of Systems Review of Systems  Constitutional:  Positive for fatigue. Negative for chills and fever.  HENT:  Negative for facial swelling and trouble swallowing.   Eyes:  Negative for photophobia and visual disturbance.  Respiratory:  Positive for cough, chest tightness and shortness of breath.   Cardiovascular:  Negative for chest pain and palpitations.  Gastrointestinal:  Negative for abdominal pain, nausea and vomiting.  Endocrine: Negative for polydipsia and polyuria.  Genitourinary:  Negative for difficulty urinating and hematuria.  Musculoskeletal:  Negative for gait problem and joint swelling.  Skin:  Negative for pallor and rash.  Neurological:  Negative for syncope and headaches.  Psychiatric/Behavioral:  Negative for agitation and confusion.     Physical Exam Vital Signs  I have reviewed the triage vital signs BP 132/79   Pulse (!) 58   Temp 99.8 F (37.7 C)   Resp 18   Ht '5\' 4"'$  (1.626 m)   Wt 84.5 kg   SpO2 94%   BMI 31.97 kg/m  Physical Exam Vitals and nursing note reviewed.  Constitutional:      General: She is not in acute distress.    Appearance: Normal appearance. She is well-developed. She is not ill-appearing or diaphoretic.  HENT:     Head: Normocephalic and atraumatic.     Right Ear: External ear normal.     Left Ear: External ear normal.     Nose: Nose normal.     Mouth/Throat:     Mouth: Mucous membranes are moist.  Eyes:     General: No scleral icterus.       Right eye: No discharge.        Left eye: No discharge.  Cardiovascular:     Rate and Rhythm: Normal rate and  regular rhythm.     Pulses: Normal pulses.     Heart sounds: Normal heart sounds.  Pulmonary:     Effort: Pulmonary effort is normal. No accessory muscle usage or respiratory distress.     Breath sounds: Wheezing present. No rales.     Comments: Wheezing diffusely throughout Abdominal:     General: Abdomen is flat.     Tenderness: There is no abdominal tenderness.  Musculoskeletal:        General: Normal range of motion.     Cervical back: Normal range of motion.     Right lower leg: No edema.     Left lower leg: No edema.  Skin:    General: Skin is warm and  dry.     Capillary Refill: Capillary refill takes less than 2 seconds.  Neurological:     Mental Status: She is alert.  Psychiatric:        Mood and Affect: Mood normal.        Behavior: Behavior normal.     ED Results and Treatments Labs (all labs ordered are listed, but only abnormal results are displayed) Labs Reviewed  BASIC METABOLIC PANEL - Abnormal; Notable for the following components:      Result Value   Glucose, Bld 102 (*)    All other components within normal limits  CBC  D-DIMER, QUANTITATIVE  I-STAT BETA HCG BLOOD, ED ([facility], [facility], [facility] ONLY)  TROPONIN I (HIGH SENSITIVITY)  TROPONIN I (HIGH SENSITIVITY)                                                                                                                          Radiology DG Chest 2 View  Result Date: 08/02/2022 CLINICAL DATA:  Shortness of breath EXAM: CHEST - 2 VIEW COMPARISON:  Chest x-ray July 02, 2021 FINDINGS: The cardiomediastinal silhouette is unchanged in contour. Bibasilar bandlike opacities, likely atelectasis. No additional focal pulmonary opacity. No pleural effusion or pneumothorax. The visualized upper abdomen is unremarkable. No acute osseous abnormality. IMPRESSION: Bibasilar bandlike opacities, likely atelectasis. Electronically Signed   By: Beryle Flock M.D.   On: 08/02/2022 19:08    Pertinent labs & imaging results that  were available during my care of the patient were reviewed by me and considered in my medical decision making (see MDM for details).  Medications Ordered in ED Medications  albuterol (VENTOLIN HFA) 108 (90 Base) MCG/ACT inhaler 1-2 puff ( Inhalation Patient Refused/Not Given 08/03/22 0029)  ipratropium-albuterol (DUONEB) 0.5-2.5 (3) MG/3ML nebulizer solution 3 mL (3 mLs Nebulization Given 08/02/22 2315)  methylPREDNISolone sodium succinate (SOLU-MEDROL) 125 mg/2 mL injection 62.5 mg (62.5 mg Intravenous Given 08/02/22 2315)                                                                                                                                     Procedures Procedures  (including critical care time)  Medical Decision Making / ED Course   MDM:  Isis Reeser is a 52 y.o. female  with past medical history as below, significant for abuse, asthma, insomnia, pulmonary nodules who presents to the ED with complaint of apnea,  chest tightness, fatigue.. The complaint involves an extensive differential diagnosis and also carries with it a high risk of complications and morbidity.  Serious etiology was considered. Ddx includes but is not limited to: In my evaluation of this patient's dyspnea my DDx includes, but is not limited to, pneumonia, pulmonary embolism, pneumothorax, pulmonary edema, metabolic acidosis, asthma, COPD, cardiac cause, anemia, anxiety, etc.    On initial assessment the patient is: Screening labs ordered in triage, will give nebulized breathing treatment, steroids, recheck Vital signs and nursing notes were reviewed    Labs reviewed, troponin negative x 2, D-dimer is negative, low risk Wells, PE is unlikely.  CBC BMP stable.  Chest x-ray with atelectasis.  Otherwise no pneumonia or pulmonary edema.  Patient with wheezing on exam, she has chronic tobacco use, no formal diagnosis of COPD but does have history of asthma. Presentation today likely secondary to COPD exacerbation  versus asthma exacerbation. Given nebulized breathing treatment in ED, Solu-Medrol.  She is feeling much better.  Wheezing has resolved. Will give patient albuterol MDI, albuterol inhaler prescription for home, steroids, doxycycline for mucus lysis.  She follow-up with PCP and with pulmonology  The patient's chest pain is not suggestive of pulmonary embolus, cardiac ischemia, aortic dissection, pericarditis, myocarditis, pulmonary embolism, pneumothorax, pneumonia, Zoster, or esophageal perforation, or other serious etiology.  Historically not abrupt in onset, tearing or ripping, pulses symmetric. EKG nonspecific for ischemia/infarction. No dysrhythmias, brugada, WPW, prolonged QT noted.   Troponin negative x2. CXR reviewed. Labs without demonstration of acute pathology unless otherwise noted above. Low HEART Score: 0-3 points (0.9-1.7% risk of MACE).   Given the extremely low risk of these diagnoses further testing and evaluation for these possibilities does not appear to be indicated at this time. Patient in no distress and overall condition improved here in the ED. Detailed discussions were had with the patient regarding current findings, and need for close f/u with PCP or on call doctor. The patient has been instructed to return immediately if the symptoms worsen in any way for re-evaluation. Patient verbalized understanding and is in agreement with current care plan. All questions answered prior to discharge.  Dib likely 2/2 asthma exacerbation vs possible COPD although she is quite young for COPD; regardless she is feeling better after nebs and steroids, breathing comfortably on ambient air, stable for dc  The patient improved significantly and was discharged in stable condition. Detailed discussions were had with the patient regarding current findings, and need for close f/u with PCP or on call doctor. The patient has been instructed to return immediately if the symptoms worsen in any way for  re-evaluation. Patient verbalized understanding and is in agreement with current care plan. All questions answered prior to discharge.      Additional history obtained: -Additional history obtained from family -External records from outside source obtained and reviewed including: Chart review including previous notes, labs, imaging, consultation notes including prior ED visits, prior admission, home medications, prior labs and imaging Follows with Dr. Christinia Gully pulmonology, was on Symbicort previously but not currently using  Lab Tests: -I ordered, reviewed, and interpreted labs.   The pertinent results include:   Labs Reviewed  BASIC METABOLIC PANEL - Abnormal; Notable for the following components:      Result Value   Glucose, Bld 102 (*)    All other components within normal limits  CBC  D-DIMER, QUANTITATIVE  I-STAT BETA HCG BLOOD, ED ([facility], [facility], [facility] ONLY)  TROPONIN I (HIGH SENSITIVITY)  TROPONIN  I (HIGH SENSITIVITY)    Notable for troponin negative x 2, D-dimer negative.  EKG   EKG Interpretation  Date/Time:  Thursday August 02 2022 18:02:05 EST Ventricular Rate:  87 PR Interval:  131 QRS Duration: 114 QT Interval:  385 QTC Calculation: 464 R Axis:   87 Text Interpretation: Sinus rhythm Low voltage, extremity and precordial leads Anteroseptal infarct, old Confirmed by Nanda Quinton [phone]) on 08/02/2022 6:04:05 PM         Imaging Studies ordered: I ordered imaging studies including chest x-ray I independently visualized the following imaging with scope of interpretation limited to determining acute life threatening conditions related to emergency care: Atelectasis, no pneumonia I independently visualized and interpreted imaging. I agree with the radiologist interpretation   Medicines ordered and prescription drug management: Meds ordered this encounter  Medications   ipratropium-albuterol (DUONEB) 0.5-2.5 (3) MG/3ML nebulizer solution 3 mL   methylPREDNISolone  sodium succinate (SOLU-MEDROL) 125 mg/2 mL injection 62.5 mg   doxycycline (VIBRAMYCIN) 100 MG capsule    Sig: Take 1 capsule (100 mg total) by mouth 2 (two) times daily for 7 days.    Dispense:  14 capsule    Refill:  0   predniSONE (DELTASONE) 20 MG tablet    Sig: Take 2 tablets (40 mg total) by mouth daily for 5 days.    Dispense:  10 tablet    Refill:  0   albuterol (VENTOLIN HFA) 108 (90 Base) MCG/ACT inhaler 1-2 puff   albuterol (VENTOLIN HFA) 108 (90 Base) MCG/ACT inhaler    Sig: Inhale 1-2 puffs into the lungs every 4 (four) hours as needed for wheezing or shortness of breath.    Dispense:  1 each    Refill:  1    -I have reviewed the patients home medicines and have made adjustments as needed   Consultations Obtained: Not applicable  Cardiac Monitoring: The patient was maintained on a cardiac monitor.  I personally viewed and interpreted the cardiac monitored which showed an underlying rhythm of: NSR  Social Determinants of Health:  Diagnosis or treatment significantly limited by social determinants of health: current smoker Counseled patient for approximately 3 minutes regarding smoking cessation. Discussed risks of smoking and how they applied and affected their visit here today. Patient not ready to quit at this time, however will follow up with their primary doctor when they are.   CPT code: [phone]: intermediate counseling for smoking cessation     Reevaluation: After the interventions noted above, I reevaluated the patient and found that they have improved  Co morbidities that complicate the patient evaluation  Past Medical History:  Diagnosis Date   Anxiety       Dispostion: Disposition decision including need for hospitalization was considered, and patient discharged from emergency department.    Final Clinical Impression(s) / ED Diagnoses Final diagnoses:  Tobacco use  Dyspnea, unspecified type     This chart was dictated using voice recognition  software.  Despite best efforts to proofread,  errors can occur which can change the documentation meaning.    Jeanell Sparrow, DO 08/03/22 [phone]

## 2022-08-02 NOTE — ED Provider Triage Note (Signed)
Emergency Medicine Provider Triage Evaluation Note  Kelli Lamb , a 52 y.o. female  was evaluated in triage.  Pt complains of complaining of pain in the epigastric area and back that started about 2 days ago, states is better, when this happened she got sweaty and nauseous.  She denies palpitations but states she is having pain in the "right back lung" and is having trouble taking a full deep breath now.  She does smoke, and has history of hypertension..  Review of Systems  Positive: Upper back pain Negative: Vomiting, fevers  Physical Exam  BP (!) 174/96 (BP Location: Left Arm)   Pulse 79   Temp 99.8 F (37.7 C) (Oral)   Resp 17   SpO2 98%  Gen:   Awake, no distress   Resp:  Normal effort  MSK:   Moves extremities without difficulty  Other:    Medical Decision Making  Medically screening exam initiated at 6:23 PM.  Appropriate orders placed.  Kelli Lamb was informed that the remainder of the evaluation will be completed by another provider, this initial triage assessment does not replace that evaluation, and the importance of remaining in the ED until their evaluation is complete.     Kelli Lamb, [place] 08/02/22 1826

## 2022-08-03 MED ORDER — ALBUTEROL SULFATE HFA 108 (90 BASE) MCG/ACT IN AERS: 1.0000 | INHALATION_SPRAY | RESPIRATORY_TRACT | 1 refills | Status: AC | PRN

## 2022-08-03 MED ORDER — PREDNISONE 20 MG PO TABS: 40.0000 mg | ORAL_TABLET | Freq: Every day | ORAL | 0 refills | Status: AC

## 2022-08-03 MED ORDER — ALBUTEROL SULFATE HFA 108 (90 BASE) MCG/ACT IN AERS
1.0000 | INHALATION_SPRAY | RESPIRATORY_TRACT | Status: DC
  Filled 2022-08-03: qty 6.7

## 2022-08-03 MED ORDER — DOXYCYCLINE HYCLATE 100 MG PO CAPS: 100.0000 mg | ORAL_CAPSULE | Freq: Two times a day (BID) | ORAL | 0 refills | Status: AC

## 2022-08-03 NOTE — Discharge Instructions (Addendum)
It was a pleasure caring for you today in the emergency department.  Please return to the emergency department for any worsening or worrisome symptoms.  

## 2023-12-01 ENCOUNTER — Emergency Department (HOSPITAL_COMMUNITY)

## 2023-12-01 ENCOUNTER — Encounter (HOSPITAL_COMMUNITY): Payer: Self-pay

## 2023-12-01 ENCOUNTER — Other Ambulatory Visit: Payer: Self-pay

## 2023-12-01 ENCOUNTER — Emergency Department (HOSPITAL_COMMUNITY)
Admission: EM | Admit: 2023-12-01 | Discharge: 2023-12-01 | Disposition: A | Discharge status: Home / Self Care | Attending: Emergency Medicine | Admitting: Emergency Medicine

## 2023-12-01 DIAGNOSIS — J40 Bronchitis, not specified as acute or chronic: Secondary | ICD-10-CM | POA: Diagnosis not present

## 2023-12-01 DIAGNOSIS — Z7951 Long term (current) use of inhaled steroids: Secondary | ICD-10-CM | POA: Diagnosis not present

## 2023-12-01 DIAGNOSIS — R0602 Shortness of breath: Secondary | ICD-10-CM | POA: Diagnosis present

## 2023-12-01 LAB — CBC
HCT: 40.8 % (ref 36.0–46.0)
Hemoglobin: 12.8 g/dL (ref 12.0–15.0)
MCH: 28.6 pg (ref 26.0–34.0)
MCHC: 31.4 g/dL (ref 30.0–36.0)
MCV: 91.1 fL (ref 80.0–100.0)
Platelets: 326 10*3/uL (ref 150–400)
RBC: 4.48 MIL/uL (ref 3.87–5.11)
RDW: 13.9 % (ref 11.5–15.5)
WBC: 10.2 10*3/uL (ref 4.0–10.5)
nRBC: 0 % (ref 0.0–0.2)

## 2023-12-01 LAB — BASIC METABOLIC PANEL WITH GFR
Anion gap: 9 (ref 5–15)
BUN: 14 mg/dL (ref 6–20)
CO2: 25 mmol/L (ref 22–32)
Calcium: 8.7 mg/dL — ABNORMAL LOW (ref 8.9–10.3)
Chloride: 106 mmol/L (ref 98–111)
Creatinine, Ser: 0.62 mg/dL (ref 0.44–1.00)
GFR, Estimated: >60 mL/min (ref >60)
Glucose, Bld: 110 mg/dL — ABNORMAL HIGH (ref 70–99)
Potassium: 4.1 mmol/L (ref 3.5–5.1)
Sodium: 140 mmol/L (ref 135–145)

## 2023-12-01 LAB — RESP PANEL BY RT-PCR (RSV, FLU A&B, COVID)  RVPGX2
Influenza A by PCR: NEGATIVE
Influenza B by PCR: NEGATIVE
Resp Syncytial Virus by PCR: NEGATIVE
SARS Coronavirus 2 by RT PCR: NEGATIVE

## 2023-12-01 MED ORDER — ALBUTEROL SULFATE (5 MG/ML) 0.5% IN NEBU: 2.5000 mg | INHALATION_SOLUTION | Freq: Four times a day (QID) | RESPIRATORY_TRACT | 0 refills | Status: AC | PRN

## 2023-12-01 MED ORDER — ALBUTEROL SULFATE (2.5 MG/3ML) 0.083% IN NEBU
5.0000 mg | INHALATION_SOLUTION | Freq: Once | RESPIRATORY_TRACT | Status: AC
  Administered 2023-12-01: 5 mg via RESPIRATORY_TRACT
  Filled 2023-12-01: qty 6

## 2023-12-01 MED ORDER — BENZONATATE 100 MG PO CAPS
100.0000 mg | ORAL_CAPSULE | Freq: Three times a day (TID) | ORAL | 0 refills | Status: AC | PRN
Start: 2023-12-01 — End: ?

## 2023-12-01 MED ORDER — AZITHROMYCIN 250 MG PO TABS: ORAL_TABLET | ORAL | 0 refills | Status: AC

## 2023-12-01 NOTE — Discharge Instructions (Addendum)
 You have bronchitis.  I have ordered antibiotics (Zpack) and please take it as prescribed  I have also prescribed Tessalon Perle as needed for cough  Finally, I refilled your albuterol  solution  See your doctor for follow-up  Return to ER if you have worse trouble breathing, fever or cough

## 2023-12-01 NOTE — ED Triage Notes (Signed)
 Pt reports ongoing fatigue since having pneumonia in December. Pt reports she developed sore throat, congestion and cough a couple weeks ago and was put on prednisone . Pt reports not feeling better and intermittent SHOB. Pt has hx of pneumonia and reports this feels the same.

## 2023-12-01 NOTE — ED Provider Notes (Addendum)
 [place] EMERGENCY DEPARTMENT AT [facility] Provider Note   CSN: [id number] Arrival date & time: 12/01/23  1947     History  Chief Complaint  Patient presents with   Shortness of Breath   Fatigue    Kelli Lamb is Lamb 53 y.o. female history of alcohol abuse, asthma, here presenting with shortness of breath.  Patient was seen at urgent care couple weeks ago.  Patient was thought to have bronchitis versus asthma exacerbation.  Patient was given Lamb course of steroids and states that the shortness of breath has improved.  She states that for the last week, she has worsening shortness of breath and wheezing again.  Patient denies any fever but has subjective chills.  The history is provided by the patient.       Home Medications Prior to Admission medications   Medication Sig Start Date End Date Taking? Authorizing Provider  albuterol  (PROVENTIL ) (5 MG/ML) 0.5% nebulizer solution Take 0.5 mLs (2.5 mg total) by nebulization every 6 (six) hours as needed for wheezing or shortness of breath. 12/01/23  Yes Kelli Duck, MD  azithromycin  (ZITHROMAX  Z-PAK) 250 MG tablet 2 po day one, then 1 daily x 4 days 12/01/23  Yes Kelli Duck, MD  albuterol  (PROVENTIL  HFA;VENTOLIN  HFA) 108 (90 BASE) MCG/ACT inhaler Inhale 1 puff into the lungs every 4 (four) hours as needed for wheezing or shortness of breath.    [provider]  albuterol  (VENTOLIN  HFA) 108 (90 Base) MCG/ACT inhaler Inhale 1-2 puffs into the lungs every 4 (four) hours as needed for wheezing or shortness of breath. 08/03/22   Kelli Favors, DO  budesonide -formoterol  (SYMBICORT ) 160-4.5 MCG/ACT inhaler Inhale 1 puff into the lungs 2 (two) times daily.    [provider]  citalopram (CELEXA) 10 MG tablet Take 10 mg by mouth daily. 08/30/14   [provider]  diphenhydrAMINE  (BENADRYL ) 25 MG tablet Take 25 mg by mouth every 6 (six) hours as needed for allergies.    [provider]   HYDROcodone -acetaminophen  (NORCO/VICODIN) 5-325 MG tablet Take 1 tablet by mouth every 4 (four) hours as needed. 10/04/19   Henderly, Britni A, PA-C  ibuprofen  (ADVIL ,MOTRIN ) 800 MG tablet Take on tablet by mouth every 8 hours for 24 hours then prn with food #30 no refill 11/03/14   Kelli Lace, MD  lidocaine  (LIDODERM ) 5 % Place 1 patch onto the skin daily. Remove & Discard patch within 12 hours or as directed by MD 10/04/19   Henderly, Britni A, PA-C  zolpidem  (AMBIEN ) 10 MG tablet Take 10 mg by mouth at bedtime.  06/23/14   [provider]      Allergies    Wellbutrin [bupropion]    Review of Systems   Review of Systems  Respiratory:  Positive for shortness of breath.   All other systems reviewed and are negative.   Physical Exam Updated Vital Signs BP (!) 124/91 (BP Location: Left Arm)   Pulse 72   Temp 98.2 F (36.8 C) (Oral)   Resp 17   Ht 5\' 4"  (1.626 Lamb)   Wt 78.5 kg   LMP 10/21/2014   SpO2 96%   BMI 29.70 kg/Lamb  Physical Exam Vitals and nursing note reviewed.  Constitutional:      Comments: Slightly uncomfortable  HENT:     Head: Normocephalic.     Mouth/Throat:     Mouth: Mucous membranes are moist.  Eyes:     Extraocular Movements: Extraocular movements intact.  Pupils: Pupils are equal, round, and reactive to light.  Cardiovascular:     Rate and Rhythm: Normal rate and regular rhythm.  Pulmonary:     Comments: Slightly tachypneic and wheezing on the left side.  No retractions Musculoskeletal:        General: Normal range of motion.     Cervical back: Normal range of motion and neck supple.  Skin:    General: Skin is warm.     Capillary Refill: Capillary refill takes less than 2 seconds.  Neurological:     General: No focal deficit present.     Mental Status: She is alert and oriented to person, place, and time.  Psychiatric:        Mood and Affect: Mood normal.        Behavior: Behavior normal.     ED Results / Procedures / Treatments    Labs (all labs ordered are listed, but only abnormal results are displayed) Labs Reviewed  BASIC METABOLIC PANEL WITH GFR - Abnormal; Notable for the following components:      Result Value   Glucose, Bld 110 (*)    Calcium 8.7 (*)    All other components within normal limits  RESP PANEL BY RT-PCR (RSV, FLU Lamb&B, COVID)  RVPGX2  CBC    EKG EKG Interpretation Date/Time:  "Sunday Dec 01 2023 20:03:24 EDT Ventricular Rate:  88 PR Interval:  144 QRS Duration:  107 QT Interval:  372 QTC Calculation: 451 R Axis:   86  Text Interpretation: Sinus rhythm Low voltage, precordial leads No significant change since last tracing Confirmed by Kelli Lamb ([id number]) on 12/01/2023 8:48:10 PM  Radiology DG Chest 2 View Result Date: 12/01/2023 CLINICAL DATA:  Shortness of breath EXAM: CHEST - 2 VIEW COMPARISON:  08/02/2022 FINDINGS: Cardiac shadow is within normal limits. Lungs are again hyperinflated. No focal infiltrate is seen. No bony abnormality is noted. IMPRESSION: No active cardiopulmonary disease. Electronically Signed   By: Kelli  Lukens Lamb.D.   On: 12/01/2023 20:21    Procedures Procedures    Medications Ordered in ED Medications  albuterol (PROVENTIL) (2.5 MG/3ML) 0.083% nebulizer solution 5 mg (has no administration in time range)    ED Course/ Medical Decision Making/ Lamb&P                                 Medical Decision Making [name] is Lamb 53 y.o. female here presenting with cough and wheezing.  Likely bronchitis versus pneumonia versus asthma exacerbation.  Will give nebulizer treatment and get chest x-ray and get CBC and BMP.  9:42 PM Patient's white blood cell count is normal.  Chest x-ray did not show any obvious pneumonia.  COVID and flu and RSV pending and she felt better after albuterol neb.  She has albuterol at home.  Will prescribe Z-Pak and cough medicine.  Addendum: Patient has no history of alcohol abuse   Problems Addressed: Bronchitis: acute illness or  injury  Amount and/or Complexity of Data Reviewed Labs: ordered. Decision-making details documented in ED Course. Radiology: ordered.  Risk Prescription drug management.    Final Clinical Impression(s) / ED Diagnoses Final diagnoses:  None    Rx / DC Orders ED Discharge Orders          Ordered    azithromycin (ZITHROMAX Z-PAK) 250 MG tablet        05" /18/25 2053    albuterol  (PROVENTIL ) (5 MG/ML)  0.5% nebulizer solution  Every 6 hours PRN        12/01/23 2053              Kelli Duck, MD 12/01/23 2142    Kelli Duck, MD 12/05/23 [phone]
